# Patient Record
Sex: Male | Born: 1990 | Race: Black or African American | Hispanic: No | Marital: Single | Smoking: Former smoker
Health system: Northeastern US, Community
[De-identification: ages and names within clinical notes are randomized; demographics above are authoritative.]

## PROBLEM LIST (undated history)

## (undated) DIAGNOSIS — I10 Essential (primary) hypertension: Secondary | ICD-10-CM

## (undated) HISTORY — PX: TESTICLE SURGERY: SHX794

---
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
## 2012-09-14 ENCOUNTER — Emergency Department (HOSPITAL_COMMUNITY): Payer: No Typology Code available for payment source

## 2012-09-14 ENCOUNTER — Encounter (HOSPITAL_COMMUNITY): Payer: Self-pay | Admitting: *Deleted

## 2012-09-14 ENCOUNTER — Emergency Department (HOSPITAL_COMMUNITY)
Admission: EM | Admit: 2012-09-14 | Discharge: 2012-09-14 | Disposition: A | Discharge status: Home / Self Care | Payer: No Typology Code available for payment source | Attending: Emergency Medicine | Admitting: Emergency Medicine

## 2012-09-14 DIAGNOSIS — M545 Low back pain: Secondary | ICD-10-CM

## 2012-09-14 DIAGNOSIS — Y929 Unspecified place or not applicable: Secondary | ICD-10-CM | POA: Insufficient documentation

## 2012-09-14 DIAGNOSIS — S0993XA Unspecified injury of face, initial encounter: Secondary | ICD-10-CM | POA: Insufficient documentation

## 2012-09-14 DIAGNOSIS — M542 Cervicalgia: Secondary | ICD-10-CM

## 2012-09-14 DIAGNOSIS — S199XXA Unspecified injury of neck, initial encounter: Secondary | ICD-10-CM | POA: Insufficient documentation

## 2012-09-14 DIAGNOSIS — IMO0002 Reserved for concepts with insufficient information to code with codable children: Secondary | ICD-10-CM | POA: Insufficient documentation

## 2012-09-14 DIAGNOSIS — Y9389 Activity, other specified: Secondary | ICD-10-CM | POA: Insufficient documentation

## 2012-09-14 DIAGNOSIS — F17200 Nicotine dependence, unspecified, uncomplicated: Secondary | ICD-10-CM | POA: Insufficient documentation

## 2012-09-14 DIAGNOSIS — S0990XA Unspecified injury of head, initial encounter: Secondary | ICD-10-CM | POA: Insufficient documentation

## 2012-09-14 MED ORDER — CYCLOBENZAPRINE HCL 10 MG PO TABS: 10.0000 mg | ORAL_TABLET | Freq: Two times a day (BID) | ORAL | Status: DC | PRN

## 2012-09-14 MED ORDER — IBUPROFEN 800 MG PO TABS
800.0000 mg | ORAL_TABLET | Freq: Once | ORAL | Status: AC
  Administered 2012-09-14: 800 mg via ORAL
  Filled 2012-09-14: qty 1
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
## 2012-09-15 NOTE — ED Provider Notes (Signed)
Medical screening examination/treatment/procedure(s) were performed by non-physician practitioner and as supervising physician I was immediately available for consultation/collaboration.  Jerrika Ledlow, MD 09/15/12 1933 

## 2013-01-15 ENCOUNTER — Encounter (HOSPITAL_COMMUNITY): Payer: Self-pay | Admitting: *Deleted

## 2013-01-15 ENCOUNTER — Emergency Department (HOSPITAL_COMMUNITY)
Admission: EM | Admit: 2013-01-15 | Discharge: 2013-01-15 | Disposition: A | Discharge status: Home / Self Care | Payer: No Typology Code available for payment source | Attending: Emergency Medicine | Admitting: Emergency Medicine

## 2013-01-15 DIAGNOSIS — W010XXA Fall on same level from slipping, tripping and stumbling without subsequent striking against object, initial encounter: Secondary | ICD-10-CM | POA: Insufficient documentation

## 2013-01-15 DIAGNOSIS — Y9289 Other specified places as the place of occurrence of the external cause: Secondary | ICD-10-CM | POA: Insufficient documentation

## 2013-01-15 DIAGNOSIS — IMO0002 Reserved for concepts with insufficient information to code with codable children: Secondary | ICD-10-CM | POA: Insufficient documentation

## 2013-01-15 DIAGNOSIS — S0993XA Unspecified injury of face, initial encounter: Secondary | ICD-10-CM | POA: Insufficient documentation

## 2013-01-15 DIAGNOSIS — F17200 Nicotine dependence, unspecified, uncomplicated: Secondary | ICD-10-CM | POA: Insufficient documentation

## 2013-01-15 DIAGNOSIS — M62838 Other muscle spasm: Secondary | ICD-10-CM

## 2013-01-15 DIAGNOSIS — M549 Dorsalgia, unspecified: Secondary | ICD-10-CM

## 2013-01-15 DIAGNOSIS — Y93E9 Activity, other interior property and clothing maintenance: Secondary | ICD-10-CM | POA: Insufficient documentation

## 2013-01-15 MED ORDER — METHOCARBAMOL 750 MG PO TABS: 750.0000 mg | ORAL_TABLET | Freq: Four times a day (QID) | ORAL | Status: DC | PRN

## 2013-01-15 NOTE — ED Notes (Signed)
PT is here with back pain that radiates to neck and hurts with bending and reports neck cramps.  No incontinence
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
## 2013-01-16 NOTE — ED Provider Notes (Signed)
History/physical exam/procedure(s) were performed by non-physician practitioner and as supervising physician I was immediately available for consultation/collaboration. I have reviewed all notes and am in agreement with care and plan.   Nautica Hotz S Kvon Mcilhenny, MD 01/16/13 1203 

## 2013-11-03 ENCOUNTER — Emergency Department (HOSPITAL_COMMUNITY)
Admission: EM | Admit: 2013-11-03 | Discharge: 2013-11-03 | Disposition: A | Discharge status: Home / Self Care | Payer: No Typology Code available for payment source | Attending: Emergency Medicine | Admitting: Emergency Medicine

## 2013-11-03 ENCOUNTER — Other Ambulatory Visit: Payer: Self-pay

## 2013-11-03 ENCOUNTER — Encounter (HOSPITAL_COMMUNITY): Payer: Self-pay | Admitting: Emergency Medicine

## 2013-11-03 DIAGNOSIS — Y92009 Unspecified place in unspecified non-institutional (private) residence as the place of occurrence of the external cause: Secondary | ICD-10-CM | POA: Insufficient documentation

## 2013-11-03 DIAGNOSIS — W1809XA Striking against other object with subsequent fall, initial encounter: Secondary | ICD-10-CM | POA: Insufficient documentation

## 2013-11-03 DIAGNOSIS — Z87898 Personal history of other specified conditions: Secondary | ICD-10-CM

## 2013-11-03 DIAGNOSIS — R55 Syncope and collapse: Secondary | ICD-10-CM | POA: Insufficient documentation

## 2013-11-03 DIAGNOSIS — F17200 Nicotine dependence, unspecified, uncomplicated: Secondary | ICD-10-CM | POA: Insufficient documentation

## 2013-11-03 DIAGNOSIS — Y9389 Activity, other specified: Secondary | ICD-10-CM | POA: Insufficient documentation

## 2013-11-03 DIAGNOSIS — I498 Other specified cardiac arrhythmias: Secondary | ICD-10-CM | POA: Insufficient documentation

## 2013-11-03 DIAGNOSIS — Z043 Encounter for examination and observation following other accident: Secondary | ICD-10-CM | POA: Insufficient documentation

## 2013-11-03 DIAGNOSIS — Z79899 Other long term (current) drug therapy: Secondary | ICD-10-CM | POA: Insufficient documentation

## 2013-11-03 HISTORY — DX: Essential (primary) hypertension: I10

## 2013-11-03 LAB — BASIC METABOLIC PANEL
BUN: 11 mg/dL (ref 6–23)
CO2: 26 meq/L (ref 19–32)
Calcium: 8.7 mg/dL (ref 8.4–10.5)
Chloride: 104 mEq/L (ref 96–112)
Creatinine, Ser: 0.97 mg/dL (ref 0.50–1.35)
GFR calc Af Amer: >90 mL/min (ref >90)
GLUCOSE: 100 mg/dL — AB (ref 70–99)
POTASSIUM: 4 meq/L (ref 3.7–5.3)
Sodium: 141 mEq/L (ref 137–147)

## 2013-11-03 LAB — CBC
HEMATOCRIT: 47.1 % (ref 39.0–52.0)
HEMOGLOBIN: 15.9 g/dL (ref 13.0–17.0)
MCH: 32 pg (ref 26.0–34.0)
MCHC: 33.8 g/dL (ref 30.0–36.0)
MCV: 94.8 fL (ref 78.0–100.0)
Platelets: 213 10*3/uL (ref 150–400)
RBC: 4.97 MIL/uL (ref 4.22–5.81)
RDW: 13 % (ref 11.5–15.5)
WBC: 5.8 10*3/uL (ref 4.0–10.5)

## 2013-11-03 NOTE — ED Notes (Signed)
Pt states hes been having off and on dizzy spells for a few months. Pt states he's passed out twice this year. Pt states he passed out a week ago. Denies symptoms at this time.

## 2013-11-03 NOTE — Discharge Instructions (Signed)
Syncope Syncope is a fainting spell. This means the person loses consciousness and drops to the ground. The person is generally unconscious for less than 5 minutes. The person may have some muscle twitches for up to 15 seconds before waking up and returning to normal. Syncope occurs more often in elderly people, but it can happen to anyone. While most causes of syncope are not dangerous, syncope can be a sign of a serious medical problem. It is important to seek medical care.  CAUSES  Syncope is caused by a sudden decrease in blood flow to the brain. The specific cause is often not determined. Factors that can trigger syncope include:  Taking medicines that lower blood pressure.  Sudden changes in posture, such as standing up suddenly.  Taking more medicine than prescribed.  Standing in one place for too long.  Seizure disorders.  Dehydration and excessive exposure to heat.  Low blood sugar (hypoglycemia).  Straining to have a bowel movement.  Heart disease, irregular heartbeat, or other circulatory problems.  Fear, emotional distress, seeing blood, or severe pain. SYMPTOMS  Right before fainting, you may:  Feel dizzy or lightheaded.  Feel nauseous.  See all white or all black in your field of vision.  Have cold, clammy skin. DIAGNOSIS  Your caregiver will ask about your symptoms, perform a physical exam, and perform electrocardiography (ECG) to record the electrical activity of your heart. Your caregiver may also perform other heart or blood tests to determine the cause of your syncope. TREATMENT  In most cases, no treatment is needed. Depending on the cause of your syncope, your caregiver may recommend changing or stopping some of your medicines. HOME CARE INSTRUCTIONS  Have someone stay with you until you feel stable.  Do not drive, operate machinery, or play sports until your caregiver says it is okay.  Keep all follow-up appointments as directed by your  caregiver.  Lie down right away if you start feeling like you might faint. Breathe deeply and steadily. Wait until all the symptoms have passed.  Drink enough fluids to keep your urine clear or pale yellow.  If you are taking blood pressure or heart medicine, get up slowly, taking several minutes to sit and then stand. This can reduce dizziness. SEEK IMMEDIATE MEDICAL CARE IF:   You have a severe headache.  You have unusual pain in the chest, abdomen, or back.  You are bleeding from the mouth or rectum, or you have black or tarry stool.  You have an irregular or very fast heartbeat.  You have pain with breathing.  You have repeated fainting or seizure-like jerking during an episode.  You faint when sitting or lying down.  You have confusion.  You have difficulty walking.  You have severe weakness.  You have vision problems. If you fainted, call your local emergency services (911 in U.S.). Do not drive yourself to the hospital.  MAKE SURE YOU:  Understand these instructions.  Will watch your condition.  Will get help right away if you are not doing well or get worse. Document Released: 05/11/2005 Document Revised: 11/10/2011 Document Reviewed: 07/10/2011 [facility] Patient Information 2014 [place].   Emergency Department Resource Guide 1) Find a Doctor and Pay Out of Pocket Although you won't have to find out who is covered by your insurance plan, it is a good idea to ask around and get recommendations. You will then need to call the office and see if the doctor you have chosen will accept you as a  new patient and what types of options they offer for patients who are self-pay. Some doctors offer discounts or will set up payment plans for their patients who do not have insurance, but you will need to ask so you aren't surprised when you get to your appointment.  2) Contact Your Local Health Department Not all health departments have doctors that can see patients for  sick visits, but many do, so it is worth a call to see if yours does. If you don't know where your local health department is, you can check in your phone book. The CDC also has a tool to help you locate your state's health department, and many state websites also have listings of all of their local health departments.  3) Find a [facility] If your illness is not likely to be very severe or complicated, you may want to try a walk in clinic. These are popping up all over the country in pharmacies, drugstores, and shopping centers. They're usually staffed by nurse practitioners or physician assistants that have been trained to treat common illnesses and complaints. They're usually fairly quick and inexpensive. However, if you have serious medical issues or chronic medical problems, these are probably not your best option.  No Primary Care Doctor: - Call Health Connect at  [phone] - they can help you locate a primary care doctor that  accepts your insurance, provides certain services, etc. - Physician Referral Service- [phone]  Chronic Pain Problems: Organization         Address  Phone   Notes  [facility]  [phone] Patients need to be referred by their primary care doctor.   Medication Assistance: Organization         Address  Phone   Notes  [facility] Medication [facility] [address] [place], [place] [address] [phone] --Must be a resident of [facility] -- Must have NO insurance coverage whatsoever (no Medicaid/ Medicare, etc.) -- The pt. MUST have a primary care doctor that directs their care regularly and follows them in the community   [facility]  [phone]   [employer]  [phone]    Agencies that provide inexpensive medical care: Organization         Address  Phone   Notes  [place]  [phone]   [facility]    [phone]   [facility]  [place], [place] [address] [phone]   [place] [address], [place] [phone]   Planned Parenthood    [phone]   [facility]    [phone]   [address], [place] Phone:  [phone], Fax:  [phone] Hours of Operation:  9 am - 6 pm, M-F.  Also accepts Medicaid/Medicare and self-pay.  [facility] for [place], [address], [place] Phone: [phone], Fax: [phone]. Hours of Operation:  8:30 am - 5:30 pm, M-F.  Also accepts Medicaid and self-pay.  [facility] [place] [address] [place] Phone: [phone]   [place], [place], [place] [phone], Ext. [phone] Mondays & Thursdays: 7-9 AM.  First 15 patients are seen on a first come, first serve basis.    [place] Providers:  Organization         Address  Phone   Notes  [facility] [address], [place] [phone] Also accepts self-pay patients.  [facility] [address], [place]  [phone]   [place], [address], [place] [phone]   [facility] [address] [place] [phone]   Lucianne Lei [address], [place]   [phone] Only accepts [place] Access [place] patients after they have their name applied to their card.   Self-Pay (no insurance) in [facility]:  Organization         Address  Phone   Notes  Sickle Cell Patients, [facility] [place] [phone]   [facility] [place] [phone]   [facility] [place], [place], [place] [phone]   [facility]/Dr. Osei-Bonsu  [address], [place] or [address], [place] [phone] Phone number for both [place] and [place] locations is the same.  [facility] [address], [place] [phone]   [facility] [address], [place] or [address] [phone]   [facility] [address] [place] [phone], phone; [phone], fax Sees patients 1st and 3rd Saturday of every month.  Must not qualify for public or private insurance (i.e. Medicaid, Medicare, [place] Health Choice, Veterans' Benefits)  Household income should be no more than 200% of the poverty level The clinic cannot treat you if you are pregnant or think you are pregnant  Sexually transmitted diseases are not treated at the clinic.    Dental Care: Organization         Address  Phone  Notes  [facility] [place] [phone] Accepts children up to age 31 who are enrolled in [place] or [place]; pregnant women with a Medicaid card; and children who have applied for Medicaid or [place] Health Choice, but were declined, whose parents can pay a reduced fee at time of service.  [facility] Department of [facility]  [address], [place] [phone] Accepts children up to age 66 who are enrolled in [place] or [place]; pregnant women with a Medicaid card; and children who have applied for Medicaid or [place] Health Choice, but were declined, whose parents can pay a reduced fee at time of service.  [place] Adult [facility] Access PROGRAM  [place] [phone] Patients are seen by appointment only. Walk-ins are not accepted. [place] will see patients 31 years of age and older. Monday - Tuesday (8am-5pm) Most Wednesdays (8:30-5pm) $30 per visit, cash only  [facility] Adult Dental Access PROGRAM  [address], [facility] [phone] Patients are seen by appointment only. Walk-ins are not accepted. [place] will see patients 34 years of age and older. One Wednesday Evening (Monthly: Volunteer Based).  $30 per visit, cash only  [place]  [phone] for adults; Children under age 2, call Graduate Pediatric Dentistry at [phone]. Children aged 25-14, please call [phone] to request a pediatric application.  Dental services are provided in all areas of dental care including fillings, crowns  and bridges, complete and partial dentures, implants, gum treatment, root canals, and extractions. Preventive care is also provided. Treatment is provided to both adults and children. Patients are selected via a lottery and there is often a waiting list.   [facility] [address], [place]  [phone] [email or web address]   [facility] [address], [place], [place] [phone], Ext. [phone] Second and Fourth Thursday of each month, opens at 6:30 AM; Clinic ends at 9 AM.  Patients are seen on a first-come first-served basis, and a limited number are seen during each clinic.   [facility]  [address], [place], [place] [phone]   Eligibility Requirements You must have lived in [place], [place], or [place] counties for at least the last three months.   You cannot be eligible for state or federal sponsored [place] Cityhealthcare insurance, including CIG[employer]Veterans Administration, [place]icaid, or [employer]Medicare.   You generally cannot be eligible for healthcare insurance through your employer.    How to apply: Eligibility screenings are held every Tuesday and Wednesday afternoon from 1:00 pm until 4:00 pm. You do not need an appointment for the interview!  [facility] [address], [place], [place] [phone]   [facility]  [phone]   [facility]  [phone]    [facility]  [phone]    Behavioral Health Resources in the Community: Intensive Outpatient Programs Organization         Address  Phone  Notes  [facility] [address], [place], [place] [phone]   [facility] [address], [place], [place] [phone]   ADS: [facility] Drug S[facility] [address], [place], [place]  [phone]   [facility] [address],  [place], [place] [phone] or [phone]   Substance Abuse Resources Organization         Address  Phone  Notes  [facility] Drug Services  [phone]   [facility]  [phone]   [facility]  [phone]   [facility]  [phone]   Residential & Outpatient Substance Abuse Program  [phone]   Psychological Services Organization         Address  Phone  Notes  [facility]  [phone]- [phone]   [facility]  [phone]- [phone]   [facility] [address] [facility] [address], [place] [phone] or [phone]    Mobile Crisis Teams Organization         Address  Phone  Notes  [facility], Mobile Crisis Care Unit  [phone]   [facility]  [address] [place], [place] [phone]   Doristine LocksSharon DeEsch [address] [place] [phone]    Self-Help/Support Groups Organization         Address  Phone             Notes  Mental Health Assoc. of [place] - variety of support groups  [phone] Call for more information  Narcotics Anonymous (NA), [facility] [address], [place]  2 meetings at this location   Statisticianesidential Treatment Programs Organization         Address  Phone  Notes  [facility] [address],    [place]  [phone]   [facility]  [address], [place] [phone], [place], [place] [phone]   [facility] [address], [place]  [phone] Admissions: 8am-3pm M-F  Incentives Substance [place] [address],    [place], [place] [phone]   [facility] [address] [place], [place], [place]   [facility] [address]  [place], [place]   [facility] - Intensive Outpatient [address], [place], [place]   [facility] ([place] [address]  [place], [place] [phone] or [phone]   [facility] (RTS) [address] [place], [place] Accepts Medicaid  [facility] [address],  [place] [phone] Substance Abuse/Addiction Treatment   [facility] Organization         Address  Phone  Notes  [facility]  [phone]   Domenic Schwab, PhD [address] [place], [place]   [phone] or [phone]   [place], [place] [phone]   [facility] [address], [place], [place] [phone] Insurance/Medicaid/sponsorship through [facility] and Families [address]                                    [place], [place] [phone] [place] [address], [place] [phone]    Dr. Adele Schilder  [phone]   [facility] Dept. 1) [address], [place] 2) [place] 3)  [place] [address], [place] [phone]  [phone]   [place] [phone] or [phone] (After Hours)

## 2013-11-03 NOTE — ED Notes (Signed)
The pts bp has been running high for awhile but he has never had it checked by a regular doctor to place him on meds.  4-5 days ago the pt reported that he fainted so he decided to come in for a check.  He is having a migraine headache now

## 2013-11-03 NOTE — ED Provider Notes (Signed)
CSN: [id number]     Arrival date & time 11/03/13  1817 History   First MD Initiated Contact with Patient 11/03/13 2152     Chief Complaint  Patient presents with  . Hypertension     (Consider location/radiation/quality/duration/timing/severity/associated sxs/prior Treatment) HPI  23 year old male presents ER for evaluation of recurrent syncope. Patient states since last year he has had several syncopal episodes.  He reports an episode happens when he stands up quickly, and sometimes happen for no reason. Last week he was at home, and when you get up from his chair he felt lightheadedness and dizziness and subsequently syncopized, fell forward hitting his forehead against the kitchen counter. He reports having 3 more episodes of syncope on that same date. He reports walk however they've recommend for him to be evaluated for his syncope. Throughout the week more family members urging him to go get checked out tonight he decided to come to the ER for further evaluation. At this time patient has no specific complaint. He reports that he has a family history of high blood pressure and he was told that he has high blood pressure however currently not taking any medication for it. He admits to using marijuana on occasion but none correlating with the syncope. Denies any other street drug use. Patient is a smoker. Denies history of alcohol abuse. Denies any prior history of exertional syncope. Denies heart palpitation. Does not think that he is dehydrated. Patient has no other complaints.  Past Medical History  Diagnosis Date  . Hypertension    History reviewed. No pertinent past surgical history. No family history on file. History  Substance Use Topics  . Smoking status: Current Every Day Smoker -- 1.00 packs/day    Types: Cigarettes  . Smokeless tobacco: Not on file  . Alcohol Use: Yes     Comment: occa    Review of Systems  All other systems reviewed and are negative.     Allergies   Shellfish allergy  Home Medications   Prior to Admission medications   Medication Sig Start Date End Date Taking? Authorizing Provider  cetirizine (ZYRTEC) 10 MG tablet Take 10 mg by mouth daily.   Yes Historical Provider, MD   BP 126/90  Pulse 76  Temp(Src) 98.6 F (37 C) (Oral)  Resp 16  Ht 5\' 8"  (1.727 m)  Wt 160 lb (72.576 kg)  BMI 24.33 kg/m2  SpO2 100% Physical Exam  Nursing note and vitals reviewed. Constitutional: He is oriented to person, place, and time. He appears well-developed and well-nourished. No distress.  Awake, alert, nontoxic appearance  HENT:  Head: Atraumatic.  Eyes: Conjunctivae are normal. Pupils are equal, round, and reactive to light. Right eye exhibits no discharge. Left eye exhibits no discharge.  Neck: Normal range of motion. Neck supple.  Cardiovascular: Regular rhythm, S1 normal and S2 normal.  Bradycardia present.   Pulmonary/Chest: Effort normal. No respiratory distress. He exhibits no tenderness.  Abdominal: Soft. There is no tenderness. There is no rebound.  Musculoskeletal: He exhibits no tenderness.  ROM appears intact, no obvious focal weakness  Neurological: He is alert and oriented to person, place, and time.  Skin: Skin is warm and dry. No rash noted.  Psychiatric: He has a normal mood and affect.    ED Course  Procedures (including critical care time)  10:21 PM Patient report recurrent syncopal episode for the past year with no prior workup. EKG did demonstrate evidence of sinus bradycardia but otherwise his labs are reassuring and  normal electrolytes.  Pt appears to be in shape, sinus brady may be his baseline.  However, i will check orthostatic vital sign.  If normal, pt will receive resources to find a PCP and i will give referral to cardiology for further care.  Smoking cessation discussed.  Pt currently has normal BP, no evidence of HTN on today's exam.    10:59 PM Pt will need to f/u for further management of his condition.   Normal orthostatic vital sign.  Pt request doctor's note to return to work.  Note given.  Care discussed with Dr. Gwendolyn GrantWalden.  Labs Review Labs Reviewed  BASIC METABOLIC PANEL - Abnormal; Notable for the following:    Glucose, Bld 100 (*)    All other components within normal limits  CBC    Imaging Review No results found.   EKG Interpretation None      Date: 11/03/2013  Rate: 51  Rhythm: sinus bradycardia  QRS Axis: normal  Intervals: normal  ST/T Wave abnormalities: normal  Conduction Disutrbances:none  Narrative Interpretation:   Old EKG Reviewed: none available    MDM   Final diagnoses:  H/O syncope    BP 114/77  Pulse 48  Temp(Src) 98.6 F (37 C) (Oral)  Resp 15  Ht 5\' 8"  (1.727 m)  Wt 160 lb (72.576 kg)  BMI 24.33 kg/m2  SpO2 100%     Fayrene HelperBowie Avin Gibbons, PA-C 11/03/13 2306

## 2013-11-04 NOTE — ED Provider Notes (Signed)
Medical screening examination/treatment/procedure(s) were performed by non-physician practitioner and as supervising physician I was immediately available for consultation/collaboration.   EKG Interpretation None         William Louden House[place], MD 11/04/13 0025 
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
## 2019-02-22 ENCOUNTER — Telehealth (HOSPITAL_BASED_OUTPATIENT_CLINIC_OR_DEPARTMENT_OTHER): Payer: Self-pay | Admitting: Family Medicine

## 2019-02-22 DIAGNOSIS — Z20828 Contact with and (suspected) exposure to other viral communicable diseases: Secondary | ICD-10-CM

## 2019-02-22 DIAGNOSIS — Z20822 Contact with and (suspected) exposure to covid-19: Secondary | ICD-10-CM

## 2019-02-22 NOTE — Progress Notes (Signed)
COVID-19     Review of Systems  Physical Exam

## 2019-03-01 ENCOUNTER — Ambulatory Visit
Admission: RE | Admit: 2019-03-01 | Discharge: 2019-03-01 | Disposition: A | Discharge status: Home / Self Care | Payer: BC Managed Care – PPO | Attending: Family Medicine | Admitting: Family Medicine

## 2019-03-06 LAB — COVID-19 CARE EVERYWHERE: COVID-19 CARE EVERYWHERE: NOT DETECTED — NL

## 2019-03-23 ENCOUNTER — Other Ambulatory Visit: Payer: Self-pay

## 2019-03-31 ENCOUNTER — Other Ambulatory Visit: Payer: Self-pay

## 2019-03-31 ENCOUNTER — Ambulatory Visit (HOSPITAL_BASED_OUTPATIENT_CLINIC_OR_DEPARTMENT_OTHER): Payer: BC Managed Care – PPO | Admitting: Family Medicine

## 2019-05-03 LAB — COVID-19 CARE EVERYWHERE: COVID-19 CARE EVERYWHERE: NOT DETECTED — NL

## 2019-05-10 ENCOUNTER — Ambulatory Visit (HOSPITAL_BASED_OUTPATIENT_CLINIC_OR_DEPARTMENT_OTHER): Payer: BC Managed Care – PPO | Admitting: Family Medicine

## 2019-05-11 ENCOUNTER — Other Ambulatory Visit: Payer: Self-pay

## 2019-05-11 ENCOUNTER — Ambulatory Visit (HOSPITAL_BASED_OUTPATIENT_CLINIC_OR_DEPARTMENT_OTHER): Payer: Self-pay | Admitting: Family Medicine

## 2019-05-28 ENCOUNTER — Other Ambulatory Visit: Payer: Self-pay

## 2019-05-29 ENCOUNTER — Encounter (HOSPITAL_BASED_OUTPATIENT_CLINIC_OR_DEPARTMENT_OTHER): Payer: Self-pay | Admitting: Family Medicine

## 2019-05-29 ENCOUNTER — Ambulatory Visit: Payer: BC Managed Care – PPO | Attending: Family Medicine | Admitting: Family Medicine

## 2019-05-29 DIAGNOSIS — M2141 Flat foot [pes planus] (acquired), right foot: Secondary | ICD-10-CM | POA: Insufficient documentation

## 2019-05-29 DIAGNOSIS — Z0000 Encounter for general adult medical examination without abnormal findings: Secondary | ICD-10-CM | POA: Insufficient documentation

## 2019-05-29 DIAGNOSIS — D229 Melanocytic nevi, unspecified: Secondary | ICD-10-CM | POA: Insufficient documentation

## 2019-05-29 DIAGNOSIS — M2142 Flat foot [pes planus] (acquired), left foot: Secondary | ICD-10-CM | POA: Insufficient documentation

## 2019-05-29 NOTE — Progress Notes (Signed)
CC: establish care     Luke Hammond is a 29 year old male presenting for to establish care    [facility]  Would like to be more active. Works out 3-5 times a week.  Yoga 5x a week  Tries to eat mostly vegetarian diet   Would like to get STD testing as it has been >6 months  Does report some isolation in working from home but manageable    #flat feet  -starting to affect hips, causing more pain  -would like to see a podiatrist    #atypical moles  -has a few atypical moles he would like to get looked at and possibly removed   -would like to see a dermatologist      There is no problem list on file for this patient.       Review of patient's family history indicates:  Problem: Diabetes      Relation: Father          Age of Onset: (Not Specified)  Problem: Thyroid      Relation: Father          Age of Onset: (Not Specified)       No current outpatient medications on file prior to visit.  No current facility-administered medications on file prior to visit.        Review of Patient's Allergies indicates:   Penicillins             Hives, Itching, Shortness of Breath    Comment:PCN Hives.--     Social History    Tobacco Use      Smoking status: Former Smoker        Types: Cigars    Alcohol use: Yes    Drug use: Yes      Types: Marijuana         Exam - vitals deferred in setting of COVID pandemic  Speaking in complete sentences, breathing comfortably       Assessment/Plan:  (Z00.00) Routine general medical examination at a health care facility  (primary encounter diagnosis)  Plan: HIV ANTIGEN ANTIBODY 5TH GEN, RPR, CHLAMYDIA GC        NAAT, LIPID PANEL, BASIC METABOLIC PANEL,         HEPATITIS C ANTIBODY    (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

## 2019-05-30 ENCOUNTER — Ambulatory Visit: Payer: BC Managed Care – PPO | Attending: Dermatology | Admitting: Dermatology

## 2019-05-30 ENCOUNTER — Encounter (HOSPITAL_BASED_OUTPATIENT_CLINIC_OR_DEPARTMENT_OTHER): Payer: Self-pay | Admitting: Dermatology

## 2019-05-30 ENCOUNTER — Other Ambulatory Visit: Payer: Self-pay

## 2019-05-30 DIAGNOSIS — D239 Other benign neoplasm of skin, unspecified: Secondary | ICD-10-CM | POA: Diagnosis not present

## 2019-05-30 DIAGNOSIS — D2271 Melanocytic nevi of right lower limb, including hip: Secondary | ICD-10-CM | POA: Diagnosis not present

## 2019-05-30 NOTE — Progress Notes (Signed)
Luke Hammond is a 29 year old male patientatypical moles, would like screening skin exam    HPI:   Luke Hammond is a very pleasant 29 year old male who presents for evaluation of a lesion of concern.  According to Randa Ngo lesion has been present for approximately 1 year on anterior thigh. Stable in size, color, and general appearance but initially became more raised when first developed and now stable. No bleeding. Irritating because it is raised. No bleeding. Also has a raised mole on R inner thigh also raised but Stable in size, color, and general appearance.  Asymptomatic, non-itchy, non-tender.    Also has a raised Wortmann bump on lower L leg x 5 years. Started as small bump but gradually over time. Now Stable in size, color, and general appearance. Does not recall any preceding trauma. Asymptomatic, non-itchy, non-tender.    No other dermatologic complaints or concerns today.    Pt reports a personal past dermatologic history of: none    Pt reports a familial past dermatologic history of: No personal/ family history of skin cancer.    Current use of sunscreen or sun protective clothing: increased sun exposure in the past, pcc use of sunscreen.        Soc hx: +marijuana. +etoh. accountant    Review of Systems - ,No recent fevers, chills, night sweats, weight loss, nausea, vomiting, diarrhea, headaches, cough, shortness of breath, arthralgias, myalgias      Past Medical History:  Past Medical History:  No date: NO SIGNIFICANT MEDICAL HISTORY    Current Medications:  No current outpatient medications on file.     No current facility-administered medications for this visit.        Allergies:  Review of Patient's Allergies indicates:   Penicillins             Hives, Itching, Shortness of Breath    Comment:PCN Hives.--    Hospital Problem List:  Active Problems:    * No active hospital problems. *      Vitals:  Extended Vitals not filed for this encounter.    Physical Exam  - L lower leg with round dome  shaped papule  - R inner thigh with Tukes dome shaped papule  - R anterior thigh with round but slightly irregular shaped papule      Assessment:  Very pleasant 29 year old male who presents for evaluation of a lesion of concern    1. Melanocytic nevi, R thigh x 2, benign appearing but R anterior thigh is irritated: All nevi benign appearing and similar to one another on exam. The importance of sun protection was discussed along with best methods (including clothing, sunscreens, avoidance of peak UV index hours). The alarming signs of pigmented (including ABCDEs) and non-pigmented (including nonhealing ulcers and scars without injury) skin lesions were reviewed. Self skin monitoring advised, and pt encouraged to report any suspicious lesions immediately.  -consider bx of irritated nevus on R anterior thigh    2. Dermatofibroma, L lower leg:  - Discussed dx. Reassured benign. ddx also include melanocytic nevus - benign appearing.      RTC 3 mos, sooner prn any changes, consider bx of irritated nevus on R anterior thigh.      Darnell Level, MD  05/30/2019

## 2019-05-30 NOTE — Telephone Encounter (Signed)
Photos for today's TV

## 2019-05-31 ENCOUNTER — Other Ambulatory Visit: Payer: Self-pay

## 2019-05-31 ENCOUNTER — Ambulatory Visit
Admission: RE | Admit: 2019-05-31 | Discharge: 2019-05-31 | Disposition: A | Discharge status: Home / Self Care | Payer: BC Managed Care – PPO | Attending: Family Medicine | Admitting: Family Medicine

## 2019-05-31 ENCOUNTER — Encounter (HOSPITAL_BASED_OUTPATIENT_CLINIC_OR_DEPARTMENT_OTHER): Payer: Self-pay | Admitting: Family Medicine

## 2019-05-31 DIAGNOSIS — Z0000 Encounter for general adult medical examination without abnormal findings: Secondary | ICD-10-CM | POA: Diagnosis not present

## 2019-05-31 LAB — BASIC METABOLIC PANEL
ANION GAP: 8 mmol/L (ref 5–15)
BUN (UREA NITROGEN): 6 mg/dL — ABNORMAL LOW (ref 7–18)
CALCIUM: 9.5 mg/dL (ref 8.5–10.1)
CARBON DIOXIDE: 29 mmol/L (ref 21–32)
CHLORIDE: 102 mmol/L (ref 98–107)
CREATININE: 1.2 mg/dL (ref 0.7–1.2)
ESTIMATED GLOMERULAR FILT RATE: >60 mL/min (ref >60)
Glucose Random: 83 mg/dL (ref 74–160)
POTASSIUM: 4.1 mmol/L (ref 3.5–5.1)
SODIUM: 139 mmol/L (ref 136–145)

## 2019-05-31 LAB — HIV ANTIGEN ANTIBODY 5TH GEN: HIVAGAB QUALITATIVE: NONREACTIVE

## 2019-05-31 LAB — LIPID PANEL
Cholesterol: 225 mg/dL (ref 0–239)
HIGH DENSITY LIPOPROTEIN: 108 mg/dL (ref 40–?)
LOW DENSITY LIPOPROTEIN DIRECT: 110 mg/dL (ref 0–189)
TRIGLYCERIDES: 54 mg/dL (ref 0–150)

## 2019-05-31 LAB — HEPATITIS C ANTIBODY: HEPATITIS C ANTIBODY: NONREACTIVE

## 2019-06-01 LAB — RPR: RPR QUAL: NONREACTIVE

## 2019-06-03 LAB — CHLAMYDIA GC NAAT
CHLAMYDIA TRACHOMATIS NAAT: NEGATIVE
NEISSERIA GONORRHOEAE NAAT: NEGATIVE

## 2019-06-06 ENCOUNTER — Encounter (HOSPITAL_BASED_OUTPATIENT_CLINIC_OR_DEPARTMENT_OTHER): Payer: Self-pay | Admitting: Podiatrist

## 2019-06-06 ENCOUNTER — Ambulatory Visit: Payer: BC Managed Care – PPO | Attending: Podiatrist | Admitting: Podiatrist

## 2019-06-06 ENCOUNTER — Other Ambulatory Visit: Payer: Self-pay

## 2019-06-06 DIAGNOSIS — Q666 Other congenital valgus deformities of feet: Secondary | ICD-10-CM | POA: Diagnosis not present

## 2019-06-06 DIAGNOSIS — M216X2 Other acquired deformities of left foot: Secondary | ICD-10-CM | POA: Diagnosis not present

## 2019-06-06 DIAGNOSIS — M216X1 Other acquired deformities of right foot: Secondary | ICD-10-CM | POA: Insufficient documentation

## 2019-06-06 DIAGNOSIS — M2142 Flat foot [pes planus] (acquired), left foot: Secondary | ICD-10-CM | POA: Insufficient documentation

## 2019-06-06 DIAGNOSIS — M2141 Flat foot [pes planus] (acquired), right foot: Secondary | ICD-10-CM | POA: Insufficient documentation

## 2019-06-06 HISTORY — DX: Flat foot (pes planus) (acquired), right foot: M21.41

## 2019-06-13 NOTE — Progress Notes (Signed)
Chief complaint: Bilateral flat feet    HPI: This 29 year old male presents with identified postural deficits to both feet consistent with pes planovalgus deformity.  He does not endorse any notable pain or dysfunction with the presentation.  He does remain quite active to include performance of yoga without challenge.  He does not suggest throughout his adolescence or early adult life having any notable secondary issues as a result of this foot presentation.  He presents today simply to have this evaluated for consideration of any preventative treatment.  He has attempted no prior formal podiatric care.    Review of Systems   HENT: Negative.    Cardiovascular: Negative.    Respiratory: Negative.    Endocrine: Negative.    Skin: Negative.    Musculoskeletal:        Per HPI   Gastrointestinal: Negative.    Neurological: Negative.      Past Medical History:  06/06/2019: Flat feet  No date: NO SIGNIFICANT MEDICAL HISTORY    Patient Active Problem List:     Flat feet    Review of Patient's Allergies indicates:   Penicillins             Hives, Itching, Shortness of Breath    Comment:PCN Hives.--    There are no discharge medications for this patient.     Past Surgical History:  No date: TESTICLE SURGERY      Comment:  as child     reports that he has quit smoking. His smoking use included cigars. He has never used smokeless tobacco. He reports current alcohol use. He reports current drug use. Drug: Marijuana.    family history includes Diabetes in his father; Thyroid in his father.    General exam: This is a 29 year old male who presents in no apparent distress.  Affect is good.  He is alert and oriented x3.    Lower extremity exam:    Vascular: Exhibits a warm and well-perfused foot bilateral.  Capillary filling time is immediate.  There is no pitting edema.  Distal pulses are palpable.  There are no prominent varicosities.  There is no calf tenderness.  Neurologic:Deep tendon reflexes patellar and ankle are equal and  symmetrical. Demonstrates intact cutaneous sensation with monofilament, tuning fork and light touch.There is absence of pathologic reflex at the ankle bilateral. There is no motor disturbance.  Orthopedic: Patient is found to be independently ambulatory in conventional footwear with no casually observed antalgic limp or altered gait.  There is no visible swelling or discoloration to either foot or ankle.  In a seated position with foot hanging patient does demonstrate limited instep vault.  He does exhibit restricted subtalar joint motion principally in E version.  Reduction to some level of neutral confirms forefoot to rear foot acquired supinatus.  With the foot held in neutral there is restriction of ankle joint dorsiflexion with both knee extension and flexion.  With weightbearing he does have a significant loss of the instep vault with notable calcaneal valgus.  Bilateral heel raise does suggest some inversion but not beyond neutral.  Hallux dorsiflexion does not reconstitute the arch.  He does not suggest any notable pain throughout the foot with evaluation.  Dermatological: Skin color and texture is within normal.  There are no open wounds or lesions.  There is no active bleeding or bruising.    Luke Hammond was seen today for flat feet.    Diagnoses and all orders for this visit:    Valgus deformity of  both feet    Acquired equinus deformity of both feet      I did have a discussion with the patient regarding his clinical presentation.  As he has been principally asymptomatic with no secondary dysfunction I am not recommending any aggressive intervention.  I do believe he would have absolute intolerance to any type of functional orthotic as this is a semirigid deformity.  He would be best served with some type of accommodative soft insole if required.  I did discuss appropriate stretching of the posterior muscle groups to the lower legs.  I explained that there is no literature or personal experience that would  suggest that he will have any notable problems with this down the road that require any prophylactic intervention at this time.  Clearly if he should develop secondary symptoms he may contact us immediately.  He endorsed understanding and was in agreement.    A majority of this note has been dictated with a voice recognition system. Occasional wrong-word or "sound-A-like" substitutions may have occurred due to the inherent limitations of voice recognition software. Read the chart carefully and recognize, using context, where substitutions have occurred. Please excuse any identified errors in spelling or syntax. Every effort has been made to appropriately edit and correct upon completion.

## 2019-06-30 ENCOUNTER — Ambulatory Visit: Payer: BC Managed Care – PPO | Attending: Family Medicine | Admitting: Family Medicine

## 2019-06-30 ENCOUNTER — Encounter (HOSPITAL_BASED_OUTPATIENT_CLINIC_OR_DEPARTMENT_OTHER): Payer: Self-pay | Admitting: Family Medicine

## 2019-06-30 ENCOUNTER — Other Ambulatory Visit: Payer: Self-pay

## 2019-06-30 DIAGNOSIS — H0011 Chalazion right upper eyelid: Secondary | ICD-10-CM | POA: Diagnosis not present

## 2019-06-30 NOTE — Progress Notes (Signed)
CC: eye complaint     Luke Hammond is a 29 year old male presenting for     #eye complaint   -x 3 weeks   -growing pea sized lump on right upper eyelid  -not painful, though slightly itchy  -not red/infected  -has not yet tried anything        Patient Active Problem List:     Flat feet       Review of patient's family history indicates:  Problem: Diabetes      Relation: Father          Age of Onset: (Not Specified)  Problem: Thyroid      Relation: Father          Age of Onset: (Not Specified)       No current outpatient medications on file prior to visit.  No current facility-administered medications on file prior to visit.        Review of Patient's Allergies indicates:   Penicillins             Hives, Itching, Shortness of Breath    Comment:PCN Hives.--     Social History    Tobacco Use      Smoking status: Former Smoker        Types: Cigars      Smokeless tobacco: Never Used    Alcohol use: Yes    Drug use: Yes      Types: Marijuana         Exam - vitals deferred in setting of COVID pandemic  Speaking in complete sentences, breathing comfortably  Pea sized lump on right upper eyelid        Assessment/Plan:  (H00.11) Chalazion of right upper eyelid  -recommend warm compresses several times a day   -reassured patient that it should start to improve, if not can get eye apt in place for next week or the following in [place] ( INT)

## 2019-07-14 ENCOUNTER — Encounter (HOSPITAL_BASED_OUTPATIENT_CLINIC_OR_DEPARTMENT_OTHER): Payer: Self-pay | Admitting: Ophthalmology

## 2019-07-14 ENCOUNTER — Ambulatory Visit: Payer: BC Managed Care – PPO | Attending: Ophthalmology | Admitting: Ophthalmology

## 2019-07-14 ENCOUNTER — Other Ambulatory Visit: Payer: Self-pay

## 2019-07-14 DIAGNOSIS — H0011 Chalazion right upper eyelid: Secondary | ICD-10-CM | POA: Diagnosis not present

## 2019-07-14 NOTE — Progress Notes (Signed)
New pt.   Has a bump inside RUL for 1 months.  Warm compress do not help.    Vision is good.    FH of Glaucoma (gr.mother).

## 2019-07-14 NOTE — Progress Notes (Signed)
for evaluation of a lump right upper lid  X 2 months    Impression,    1.Chalazion RUL  Plan-warm compresses 3-4 times per day    Options discussed-plan I and D of chalazion in the ambulatory procedure room.

## 2019-07-26 ENCOUNTER — Ambulatory Visit: Payer: BC Managed Care – PPO | Attending: Ophthalmology | Admitting: Ophthalmology

## 2019-07-26 ENCOUNTER — Other Ambulatory Visit: Payer: Self-pay

## 2019-07-26 DIAGNOSIS — H0011 Chalazion right upper eyelid: Secondary | ICD-10-CM | POA: Diagnosis present

## 2019-07-26 NOTE — Progress Notes (Signed)
for I and of chalazion RUL    The patient is having the following procedure today: I and D of chalazion   rul  The risks, benefits and alternatives of the procedure have been discussed with the ophthalmologist, and the patient has had his questions answered satisfactorily.    Universal Protocol for Invasive Procedures     Name of Procedure: I and D of Chalazion RUL     Pre- Procedure Verification Process:  A plan for performing the correct procedure, for the correct patient, at the correct site was verified. The patient was involved in the verification process. The Informed paper consent for Invasive Procedure form was signed by PATIENT and submitted into the patient's record. The proceduralist and other members of the clinical team reviewed relevant patient medical history, diagnostic test results, radiology test results and assembled all special equipment and supplies needed for this procedure. All elements of Pre- Procedure Verification Process were completed at 8:10 AM 07/26/19(date and time).      Marking the Procedure Site:   Procedure site was marked per [facility] policy. Using a surgical marker, procedure site was marked at 8:12 AM 07/26/19 (date and time)       Time-Out:   Time-Out was initiated by me. Participating team members: Advertising copywriter. All participating members of the team actively communicated and re-confirmed the following:   1) correct procedure, correct patient, correct side, correct site and correct position  2) completed informed consent  3) presence of all necessary equipment and supplies     Time-out was completed at 07/26/2019  8:12 AM           Participating Members of the TIME OUT: Rayven Hendrickson,S and Szwarc, D    Procedure:I and D of a Chalazion  Pre-pocedure Diagnosis: chalazion RUL  Post-procedure Diagnosis: same    Surgeon: Lonia Chimera,  Assistant: (nurse) Szwarc*  Type of Anesthesia:local    Findings: chalazion RUL with very thick drainage    Estimated blood Loss: insignificant  Specimens  Sent to Pathology: none  Complications: none  Other (e.g. implants): None  Drawing made:  How did patient tolerate? well  When will patient follow up prn    BP prior to procedure 131/85, pulse 60  Vital signs after the procedure: Blood pressure: 136/95 and Hear Rate 62      Nurses in the minor surgical suite at the [facility] are: Judy Pimple, RN and Sunnie Nielsen, RN

## 2019-07-26 NOTE — Addendum Note (Signed)
Addended by: Charise Carwin on: 07/26/2019 08:50 AM     Modules accepted: Orders, SmartSet

## 2019-07-26 NOTE — Patient Instructions (Signed)
Put a small amount of the antibiotic ointment in the right eye 2 times per day until tube finishes/ 4 days

## 2019-08-22 DIAGNOSIS — Z20828 Contact with and (suspected) exposure to other viral communicable diseases: Secondary | ICD-10-CM | POA: Diagnosis not present

## 2019-08-28 LAB — COVID-19 CARE EVERYWHERE
COVID-19 CARE EVERYWHERE PATIENT SYMPTOMATIC: UNDETERMINED
COVID-19 CARE EVERYWHERE: UNDETERMINED
COVID-19 CARE EVERYWHERE: UNDETERMINED
COVID-19 CARE EVERYWHERE: UNDETERMINED
COVID-19 CARE EVERYWHERE: UNDETERMINED
COVID-19 CARE EVERYWHERE: UNDETERMINED
SOURCE CARE EVERYWHERE: UNDETERMINED

## 2019-08-29 ENCOUNTER — Other Ambulatory Visit: Payer: Self-pay

## 2019-08-29 ENCOUNTER — Ambulatory Visit: Payer: BC Managed Care – PPO | Attending: Dermatology | Admitting: Dermatology

## 2019-08-29 DIAGNOSIS — D239 Other benign neoplasm of skin, unspecified: Secondary | ICD-10-CM | POA: Diagnosis not present

## 2019-08-29 NOTE — Progress Notes (Signed)
Luke Hammond is a very pleasant 29 year old male who presents to the dermatology clinic today for follow-up.    Last derm visit date: 05/30/19  Last derm visit diagnoses/concerns/ treatments:    1. Melanocytic nevi, R thigh x 2, benign appearing but R anterior thigh is irritated: All nevi benign appearing and similar to one another on exam. The importance of sun protection was discussed along with best methods (including clothing, sunscreens, avoidance of peak UV index hours). The alarming signs of pigmented (including ABCDEs) and non-pigmented (including nonhealing ulcers and scars without injury) skin lesions were reviewed. Self skin monitoring advised, and pt encouraged to report any suspicious lesions immediately.  -consider bx of irritated nevus on R anterior thigh    2. Dermatofibroma, L lower leg:  - Discussed dx. Reassured benign. ddx also include melanocytic nevus - benign appearing.      RTC 3 mos, sooner prn any changes, consider bx of irritated nevus on R anterior thigh.    Interim history:  Reports LOC from LV remain unchanged, occ irritated because they are raised. No bleeding or change in appearance or size.    Reports a new mole on L upper thigh since LV. Looks similar to other Strayer moles. Asymptomatic, non-itchy, non-tender. Stable in size, color, and general appearance. Does not recall any trauma or inflammation, bite or ingrown hairs preceding this lesion.    Rarely outdoors.     No lupus or autoimmune disease or risk factors for HIV.    Past Medical History:  06/06/2019: Flat feet  No date: NO SIGNIFICANT MEDICAL HISTORY    No current outpatient medications on file.    Review of Patient's Allergies indicates:   Penicillins             Hives, Itching, Shortness of Breath    Comment:PCN Hives.--    Physical Exam:  Gen: NAD, alert and oriented, pleasant  Focused exam of legs:  -  R inner thigh, L upper thigh, and L ankle with similar firm hyperpigmented indurated papules, +dimple sign (except for L  upper thigh, smallest). No pigment network    A/P: Very pleasant 29 year old male seen in dermatology clinic today for f/u evaluation of:    Dermatofibroma, R inner thigh, L upper thigh, and L ankle   - Discussed dx. Reassured benign. Discussed association with SLE and HIV if multiple lesions but no risk factors for these. HIV neg 05/31/19.  - handout provided    RTC prn.

## 2019-08-29 NOTE — Patient Instructions (Addendum)
Please perform regular self-skin exams, and notify your doctor if you notice lesions that are asymmetric, have irregular borders, color changes, growing in diameter, or are otherwise concerning to you. It is also recommended that you regularly wear SPF 30 or greater sunscreen and/or sun protective clothing when outdoors, wearing sunscreen has been shown to reduce the risk of skin cancers.    A - Asymmetry  B - Border  C - Color  D - Diameter  E - Evolution       ABCDEs of Melanoma   These basic guidelines are used by many dermatologists to help identify melanoma, the deadliest form of skin cancer.   Catching melanoma early could mean the difference between life and a life-threatening cancer. Knowing what to look for and performing regular self-skin exams may help you become more aware of unusual spots that should be brought to the attention of a dermatologist.   If you notice an unusual spot or a spot that has one or more of these characteristics, make an appointment with a dermatologist - preferably one who has experience with melanoma.   A - Asymmetrical Shape   Melanoma lesions are often irregular, or not symmetrical, in shape. Benign moles are usually symmetrical.    B - Border   Typically, non-cancerous moles have smooth, even borders. Melanoma lesions usually have irregular borders that are difficult to define.    C - Color   The presence of more than one color (blue, black, Coviello, tan, etc.) or the uneven distribution of color can sometimes be a warning sign of melanoma. Benign moles are usually a single shade of Hiraldo or tan.    D - Diameter   Melanoma lesions are often greater than 6 millimeters in diameter (about the size of a pencil eraser).    E E - Evolution (or Change)   The evolution of your mole(s) has become the most important factor to consider when it comes to diagnosing a melanoma. Knowing what is normal for YOU could save your life.        Sunscreens    Sun protection is an important part in  decreasing our risk of skin cancers, protecting against age spots, and minimizing wrinkles. We can actively participate in sun protection by avoiding excessive sun exposure and applying sunscreens.    We recommend daily use of sunscreens to face and tops of hands. With outdoor activities, sunscreens are recommended to exposed body areas. When applying sunscreen, be sure to use a full ounce (handful amount) to entire body 15-20 minutes prior to sun exposure. Reapply every 2 hours depending on your activity. Excessive sweating or activities which involve water require more frequent applications.    We recommend sunscreens which have both UVA and UVB protection (broad spectrum). Sun Protection factor (SPF) is a measurement of the UVB blockade. A minimum SPF of 30 is advised. When purchasing a sunscreen, look for a product which contains one of the following active ingredients: Avobenzone, Titanium Dioxide, or Zinc Oxide.    Newer products, such as Aveeno and Neutrogena, contain stabilizers which allow sunscreens to work longer on your skin. Mexoryl is another common stabilizer which is used in many cosmetic products on the market.    Suggested Sunscreens        *   Neutrogena Sunblock with Helioplex Stabilizer*        *   Aveeno Continuous Protection with Active Photobarrier Complex Stabilizer*        *     CeraVe Facial Sunscreen SPF 30*        *   Cetaphil Facial Sunscreen SPF 30*        *   Coppertone Shade Lotion*        *   Eucerin Everyday Protection SPF 30*        *   Vanicream Sunscreen+    ?    For those with sensitive skin, we recommend using a product with just a physical blocker such as titanium dioxide or zinc oxide. These can be found in Neutrogena Sensitive Skin Sunblock, Vanicream Sunscreen Sensitive Skin, Blue Lizard Sensitive Skin and EltaMD UV Physical Sunscreen.    * Available at [facility], [facility], [facility] and [facility]    + Available online or at specialty pharmacies

## 2019-09-01 ENCOUNTER — Other Ambulatory Visit: Payer: Self-pay

## 2019-09-01 ENCOUNTER — Ambulatory Visit: Payer: BC Managed Care – PPO | Attending: Ophthalmology | Admitting: Ophthalmology

## 2019-09-01 DIAGNOSIS — H0011 Chalazion right upper eyelid: Secondary | ICD-10-CM | POA: Insufficient documentation

## 2019-09-01 NOTE — Progress Notes (Signed)
For evaluation of bump on RUL after I and D of chalazion 07/26/19       Impression,    1.S/P I and D of chalazion upper lid OD    Procedure note indicate large, thick drainage at time of I and D    At this point he had a small bum at the site of previous I and D that may be residual inflammation.  There was no pyogenic granuloma with eversion of lid.    He was instructed to apply warm compresses 3-4 times per day and see what happens over the next month or so.    He will RTC if the Bump persists or worsens.

## 2019-09-01 NOTE — Progress Notes (Signed)
Pt here for fu still has chalazion RUL after  I and D 07/26/19.    Would like the rest of it drained.

## 2020-07-19 ENCOUNTER — Telehealth (HOSPITAL_BASED_OUTPATIENT_CLINIC_OR_DEPARTMENT_OTHER): Payer: Self-pay

## 2020-07-19 NOTE — Telephone Encounter (Signed)
Called patient and left a message stating Dr Santo Held, is no longer here and needs to select a pcp

## 2020-09-22 DEATH — deceased

## 2022-07-25 ENCOUNTER — Emergency Department (HOSPITAL_BASED_OUTPATIENT_CLINIC_OR_DEPARTMENT_OTHER): Payer: BC Managed Care – PPO

## 2022-07-25 ENCOUNTER — Other Ambulatory Visit: Payer: Self-pay

## 2022-07-25 ENCOUNTER — Emergency Department
Admission: RE | Admit: 2022-07-25 | Discharge: 2022-07-25 | Disposition: A | Discharge status: Home / Self Care | Payer: BC Managed Care – PPO | Attending: Emergency Medicine | Admitting: Emergency Medicine

## 2022-07-25 DIAGNOSIS — M79671 Pain in right foot: Secondary | ICD-10-CM | POA: Diagnosis present

## 2022-07-25 DIAGNOSIS — Y9289 Other specified places as the place of occurrence of the external cause: Secondary | ICD-10-CM | POA: Diagnosis not present

## 2022-07-25 DIAGNOSIS — M7989 Other specified soft tissue disorders: Secondary | ICD-10-CM | POA: Diagnosis not present

## 2022-07-25 DIAGNOSIS — Y9301 Activity, walking, marching and hiking: Secondary | ICD-10-CM | POA: Insufficient documentation

## 2022-07-25 DIAGNOSIS — X500XXA Overexertion from strenuous movement or load, initial encounter: Secondary | ICD-10-CM | POA: Insufficient documentation

## 2022-07-25 DIAGNOSIS — S92354A Nondisplaced fracture of fifth metatarsal bone, right foot, initial encounter for closed fracture: Secondary | ICD-10-CM | POA: Insufficient documentation

## 2022-07-25 DIAGNOSIS — W19XXXA Unspecified fall, initial encounter: Secondary | ICD-10-CM

## 2022-07-25 DIAGNOSIS — S92355A Nondisplaced fracture of fifth metatarsal bone, left foot, initial encounter for closed fracture: Secondary | ICD-10-CM

## 2022-07-25 NOTE — ED Provider Notes (Addendum)
Chief Concern:  Luke Hammond is a 32 year old man with no significant past medical history presenting to the ED following a right foot injury    History of Present Illness:  Luke Hammond was in his usual state of health before yesterday, when he was going home after playing basketball, accidentally stepped on a rock, and heard a pop. He went home after this, removed his shoes and per patients words, "he saw metatarsal pop out." He did not take anything for the pain. He was unable to walk/move/place pressure on his foot so he ubered to the emergency department this morning. Patient says the pain is mostly on dorsal foot, not at ankle. He has rolled his ankle before but never injured his foot in this way.     Past Medical History:  History of dermatofibromas     Past Surgical History:  Past Surgical History:  No date: TESTICLE SURGERY      Comment:  as child     Medications:  None    Allergies:  Penicillin     Social History:  Denies current alcohol, drug, tobacco use    Review of patient's family history indicates:  Problem: Diabetes      Relation: Father          Age of Onset: (Not Specified)  Problem: Thyroid      Relation: Father          Age of Onset: (Not Specified)  Problem: Glaucoma      Relation: Maternal Grandmother          Age of Onset: (Not Specified)    Labs: None to display    Imagining: Revealed non-displaced fracture at base of 5th metatarsal     Physical Exam:   07/25/22  0949   BP: 137/85   Pulse: 69   Resp: 16   Temp: 97.3 F   SpO2: 100%   Weight: 90.7 kg (200 lb)      General: Patient appears well and in no apparent distress  Cardiac: Normal S1/S2, no murmurs gallops or rubs  Pulmonary: Clear to auscultation bilaterally in anterior and posterior lung fields  Extremities: Sensitive to soft touch bilaterally. Tenderness to soft touch laterally on dorsal foot. Feet are warm, well perfused bilaterally. No edema at the ankles bilaterally however mild edema on right foot below the ankle. DP pulses 2+  bilaterally  *rest of exam deferred*     Assessment and Plan:  Luke Hammond is a 32 year old man with no significant past medical history presenting to the ED following a right foot injury, currently stable condition    Right foot and ankle X-Rays ordered, revealed non-displaced fracture at the base of the 5th metatarsal. Provided patient with post-op surgical boot, placed order for crutches, referred to podiatry for outpatient follow-up    Dispo: Home with outpatient follow-up with podiatry

## 2022-07-25 NOTE — ED Triage Note (Signed)
Pt to ED via wheelchair s/p rolling R ankle yesterday while running across the street, heard a "pop", now unable to bear weight on it.

## 2022-07-25 NOTE — Narrator Note (Signed)
Patient Disposition  Patient education for diagnosis, medications, activity, diet and follow-up.  Patient left ED 11:11 AM.  Patient rep received written instructions.    Interpreter to provide instructions: No    Patient belongings with patient: YES    Have all existing LDAs been addressed? No    Have all IV infusions been stopped? No    Destination: Discharged to home

## 2022-07-25 NOTE — Discharge Instructions (Signed)
You were seen in the ER with foot pain, and found to have a 5th metatarsal fracture.    You were given a postop shoe as well as crutches.  Please do not place any weight on your foot until you are seen by the foot doctors.    You may take Ibuprofen (also known as Advil, Motrin) 600 mg every 6 hours and/or Acetaminophen (also known as Tylenol) 500 to 1000 mg every 6 hours as needed for your pain.    Return to the emergency room with any new or concerning symptoms.

## 2022-07-29 ENCOUNTER — Ambulatory Visit: Payer: BC Managed Care – PPO | Attending: Podiatrist | Admitting: Podiatrist

## 2022-07-29 ENCOUNTER — Encounter (HOSPITAL_BASED_OUTPATIENT_CLINIC_OR_DEPARTMENT_OTHER): Payer: Self-pay | Admitting: Podiatrist

## 2022-07-29 ENCOUNTER — Other Ambulatory Visit: Payer: Self-pay

## 2022-07-29 DIAGNOSIS — S93401A Sprain of unspecified ligament of right ankle, initial encounter: Secondary | ICD-10-CM | POA: Diagnosis present

## 2022-07-29 DIAGNOSIS — S92351A Displaced fracture of fifth metatarsal bone, right foot, initial encounter for closed fracture: Secondary | ICD-10-CM | POA: Insufficient documentation

## 2022-07-29 NOTE — Progress Notes (Signed)
CC: Right foot and ankle pain    HPI  Luke Hammond is a 32 year old male who presents to clinic in follow-up from emergency department visit on 3/2.  He states that last Friday he was in the middle of an intense run when he stepped on a rock and inverted his ankle.  He states that he felt a "pop".  As this did feel different from a previous injury he did decide to be evaluated in the emergency department.  Radiographs of the right ankle and foot were obtained which did reveal a nondisplaced fracture of the base of the fifth metatarsal.  He was placed in a surgical shoe and provided with crutches.  He was also referred to our service for specialty care.  He has been unable to apply full weight to the right foot since the injury.  He has no other pedal complaints.    ROS: All systems were reviewed and are negative expect per HPI     PAST MEDICAL HISTORY  Denies    PAST SURGICAL HISTORY  Noncontributory     MEDICATIONS  None    ALLERGIES  Review of Patient's Allergies indicates:   Penicillins             Hives, Itching, Shortness of Breath    Comment:PCN Hives.--    SOCIAL HISTORY  Denies tobacco use.  Admits to occasional alcohol use and also smokes marijuana.    FAMILY HISTORY  Family history reviewed.  No pertinent family history.    OBJECTIVE:  General: AAOx3. NAD. Pleasant. Non toxic appearance.     Focused right lower extremity exam  Vascular: DP and PT pulses faintly palpable secondary to edema. Capillary fill time brisk to digits 1-5. Skin temperature and gradient is within normal without asymmetry between the extremities.  Neurologic: Gross sensation intact.  Dermatologic: No abrasions or lacerations noted.  No erythema noted.  There is mild ecchymosis noted to the lateral hindfoot.  Orthopedic: There is significant edema noted to the foot and ankle.  There is tenderness to palpation over the footprint of the ATFL as well as the base of the fifth metatarsal.  He is able to actively range the foot and ankle  although somewhat limited secondary to pain and edema.  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  No tenderness over the dorsal navicular.    RADIOLOGY:  3 nonweightbearing views of the right foot do reveal an incomplete fracture through the base of the fifth metatarsal at the metaphyseal diaphyseal junction that is nondisplaced.  There is what appears to be an avulsion type fracture of the dorsal navicular.    3 nonweightbearing views of the right ankle does not reveal any osseous abnormality.  There is increase in soft tissue density and volume along the lateral ankle and lateral hindfoot.    ASSESSMENT:  Right grade 2 ankle sprain  Right closed nondisplaced fifth metatarsal base fracture    PLAN:  Clinical and radiographic findings were discussed with the patient.  Radiographs that were obtained on 3/2 were personally reviewed.  These did reveal the fracture of the base of the fifth metatarsal.  This does appear to be in the metaphyseal diaphyseal junction however is nondisplaced.  We do believe that this can be treated nonoperatively.  There was an incidental finding of a fracture over the dorsal navicular however he has no tenderness over this area and may be related to an old injury.  He does also present with associated lateral ankle ligament injury.  We recommend immobilization for the treatment of both injuries in a CAM boot and he was dispensed this today.  He very may remove the boot to perform range of motion exercises of the ankle.  He will follow-up in 4 weeks for reevaluation or sooner if problems arise.      Tobey Grim PGY 3  Pg [phone]

## 2022-07-30 NOTE — Progress Notes (Signed)
I have discussed the patient's case with the resident and was personally seen.  I have reviewed the patient's medical history, the resident's findings on physical examination, and the patient's diagnosis and treatment plan. I agree with the documentation as written by the resident.     Luke Hammond, DPM, FACFAS

## 2022-08-26 ENCOUNTER — Other Ambulatory Visit: Payer: Self-pay

## 2022-08-26 ENCOUNTER — Ambulatory Visit
Admission: RE | Admit: 2022-08-26 | Discharge: 2022-08-26 | Disposition: A | Discharge status: Home / Self Care | Payer: BC Managed Care – PPO | Attending: Vascular & Interventional Radiology | Admitting: Vascular & Interventional Radiology

## 2022-08-26 ENCOUNTER — Encounter (HOSPITAL_BASED_OUTPATIENT_CLINIC_OR_DEPARTMENT_OTHER): Payer: Self-pay | Admitting: Podiatrist

## 2022-08-26 ENCOUNTER — Ambulatory Visit (HOSPITAL_BASED_OUTPATIENT_CLINIC_OR_DEPARTMENT_OTHER): Payer: BC Managed Care – PPO | Admitting: Podiatrist

## 2022-08-26 DIAGNOSIS — S92351D Displaced fracture of fifth metatarsal bone, right foot, subsequent encounter for fracture with routine healing: Secondary | ICD-10-CM | POA: Diagnosis present

## 2022-08-28 NOTE — Progress Notes (Signed)
Situation: Follow-up closed fracture fifth metatarsal base right    Background: This is a 32 year old male who did sustain a zone 1 fracture of the fifth metatarsal base right.  This has been treated with immobilization.  Overall he endorses doing well and there is some suggestion that he has increased his physical activity.  He is not endorsing any significant pain.    Review of Systems   Constitutional: Negative.   All other systems reviewed and are negative.    Assessment: This is a pleasant 32 year old male presents no apparent distress.  Affect is good.  He is alert and oriented x 3.  Habitus is within normal.    Patient does ambulate in walking boot right.  There is no notable swelling or discoloration of the right foot or ankle.  Neurovascular status is intact.  He does endorse some slight tenderness with palpation to the styloid process of the fifth metatarsal right.  There is no visible or palpable deformity.  He can actively range the foot at the ankle as well as flex and extend the digits.    Imaging: Updated x-rays of the right foot confirm persistence of visualization of fracture lines but without any further displacement.    Recommendation: I did have discussion with the patient regarding his clinical presentation.  I would encourage continued use of the walking boot for 2 more weeks and then gradual transition to a rigid sole athletic style shoe to tolerance.  I did discuss concerning findings of increased pain, swelling, deformity or dysfunction that would warrant communication.    Return to clinic 4 weeks    A majority of this note has been dictated with a voice recognition system. Occasional wrong-word or "sound-A-like" substitutions may have occurred due to the inherent limitations of voice recognition software. Read the chart carefully and recognize, using context, where substitutions have occurred. Please excuse any identified errors in spelling or syntax. Every effort has been made to  appropriately edit and correct upon completion.

## 2022-09-23 ENCOUNTER — Other Ambulatory Visit: Payer: Self-pay

## 2022-09-23 ENCOUNTER — Ambulatory Visit: Payer: BC Managed Care – PPO | Attending: Podiatrist | Admitting: Podiatrist

## 2022-09-23 DIAGNOSIS — S92351D Displaced fracture of fifth metatarsal bone, right foot, subsequent encounter for fracture with routine healing: Secondary | ICD-10-CM | POA: Insufficient documentation

## 2022-09-23 NOTE — Progress Notes (Signed)
Podiatry Clinic Progress Note    CC: Patient presents with:  Closed Fracture: Rt      Subjective: Luke Hammond is a 32 year old male who presents to clinic today for follow-up regarding a zone 1 fracture of the fifth metatarsal base right.  He has been treated with immobilization and has gradually transition to a athletic shoe without issue.  Overall, he reports that there is good improvement.  He does report some pain to the fracture site with increased activity as well as to the medial side of ankle right.  He reports that he has not had pain in the medial ankle prior to his injury.    ROS  Constitutional: Denies fever, chills, nausea, vomitting  Cardiovascular: Denies chest pain, shortness of breath  Pertinent positives are outlined in the HPI. ROS otherwise negative.    Lower Extremity Physical Exam  General:  AAOx3. NAD. Non toxic appearance.    Vascular: DP and PT pulses palpable. Capillary refill time immediate to all digits. Skin temperature and gradient is normal without asymmetry between the extremities.   Musculoskeletal: Ambulating nonantalgic with regular shoe gear.  Within normal limits for mass and strength for all muscle groups bilaterally.  There is mild pain on palpation to the fracture site at the fifth metatarsal base right.  There is no tenderness around the medial ankle.  Patient able to actively range the ankle without pain.  There is no noticeable edema to the fracture site.  He does present with flatfoot on resting exam bilaterally.  Dermatological: Skin is supple with normal color and texture. No open lesions noted.  Neurological: Intact epicritic sensation to bilateral feet. No appreciable gross motor disturbances.    Assessment  -Closed fracture of the base of the fifth metatarsal bone, right    Plan  -Patient was examined and all findings discussed.  -Overall, patient has been progressing well following his injury with return to rigid supportive shoe gear.  We did explain that given he  did not have pain to the right medial ankle prior to injury, this likely is a result of compensatory gait changes due to his injury and should resolve once he is fully healed.  At this time, we do not believe he requires orthotics.  He may continue to advance in activity levels.  We did encourage him to return for a reassessment if pain persists.    Carlyle Dolly, DPM, PGY-1  09/23/2022    A majority of this note has been dictated with a voice recognition system. Occasional wrong-word or "sound-A-like" substitutions may have occurred due to the inherent limitations of voice recognition software. Read the chart carefully and recognize, using context, where substitutions have occurred. Please excuse any identified errors in spelling or syntax. Every effort has been made to appropriately edit and correct upon completion.

## 2022-09-26 NOTE — Progress Notes (Signed)
I have discussed the patient's case with the resident and was personally seen.  I have reviewed the patient's medical history, the resident's findings on physical examination, and the patient's diagnosis and treatment plan. I agree with the documentation as written by the resident.     Momodou Consiglio H Mckayla Mulcahey, DPM, FACFAS

## 2022-12-30 ENCOUNTER — Ambulatory Visit: Payer: BC Managed Care – PPO | Attending: Family Medicine | Admitting: Family Medicine

## 2022-12-30 ENCOUNTER — Encounter (HOSPITAL_BASED_OUTPATIENT_CLINIC_OR_DEPARTMENT_OTHER): Payer: Self-pay | Admitting: Family Medicine

## 2022-12-30 ENCOUNTER — Other Ambulatory Visit: Payer: Self-pay

## 2022-12-30 VITALS — BP 111/72 | HR 72 | Temp 97.4°F | Ht 77.0 in | Wt 189.8 lb

## 2022-12-30 DIAGNOSIS — Z0000 Encounter for general adult medical examination without abnormal findings: Secondary | ICD-10-CM | POA: Insufficient documentation

## 2022-12-30 DIAGNOSIS — Z113 Encounter for screening for infections with a predominantly sexual mode of transmission: Secondary | ICD-10-CM | POA: Insufficient documentation

## 2022-12-30 DIAGNOSIS — Z789 Other specified health status: Secondary | ICD-10-CM | POA: Diagnosis present

## 2022-12-30 LAB — HEPATITIS B SURFACE AB QUANT: HEPATITIS B SURFACE AB QUANT: 70.7 m[IU]/mL (ref 0.0–8.4)

## 2022-12-30 LAB — HEMOGLOBIN A1C
ESTIMATED AVERAGE GLUCOSE: 120 mg/dL (ref 74–160)
HEMOGLOBIN A1C: 5.8 % — ABNORMAL HIGH (ref 4.0–5.6)

## 2022-12-30 LAB — HEPATITIS B PCR QUAL TO QUANT: HEPATITIS B SURFACE ANTIGEN: NONREACTIVE

## 2022-12-30 NOTE — Progress Notes (Signed)
Clinic Note:    SUBJECTIVE:  Luke Hammond is a 32 year old male with the following active problem list:    Patient Active Problem List:     Flat feet      CC:     Pt re-establishing care  Would like sti screen  Exercising, eating well  No smoking, minimal ETOH  Up to date on dental care    Past Medical History:  06/06/2019: Flat feet  No date: NO SIGNIFICANT MEDICAL HISTORY    Past Surgical History:  No date: TESTICLE SURGERY      Comment:  as child    No current outpatient medications on file prior to visit.  No current facility-administered medications on file prior to visit.      Review of Patient's Allergies indicates:   Penicillins             Hives, Itching, Shortness of Breath    Comment:PCN Hives.--    Review of patient's family history indicates:  Problem: Diabetes      Relation: Father          Age of Onset: (Not Specified)  Problem: Thyroid      Relation: Father          Age of Onset: (Not Specified)  Problem: Glaucoma      Relation: Maternal Grandmother          Age of Onset: (Not Specified)      Social History     Socioeconomic History    Marital status: Single     Spouse name: Not on file    Number of children: Not on file    Years of education: Not on file    Highest education level: Not on file   Occupational History    Not on file   Tobacco Use    Smoking status: Former     Types: Cigars     Passive exposure: Past    Smokeless tobacco: Never   Substance and Sexual Activity    Alcohol use: Yes    Drug use: Yes     Types: Marijuana    Sexual activity: Not Currently   Other Topics Concern    Not on file   Social History Narrative    05/29/2019    Living in [place] alone    Currently works for [employer]    Social Determinants of Health  Financial Resource Strain: Not on File (01/16/2018)      Received from [facility], [facility]          Financial Resource Strain: 0  Food Insecurity: Not on File (01/16/2018)      Received from [place], [place]      Food Insecurity          Food:  0  Transportation Needs: Not on File (01/16/2018)      Received from [facility], [facility]          Transportation: 0  Physical Activity: Not on File (01/16/2018)      Received from [facility], [place]      Physical Activity          Physical Activity: 0  Stress: Not on File (01/16/2018)      Received from [facility], [place]      Stress          Stress: 0  Social Connections: Not on File (01/16/2018)      [facility]      Social Connections          Social Connections and Isolation: 0  Intimate Partner Violence: Not on file  Housing Stability: Not on File (01/16/2018)      Received from [place], [place]      Housing Stability          Housing: 0    OBJECTIVE:  BP 111/72   Pulse 72   Temp 97.4 F (36.3 C) (Temporal)   Ht 6\' 5"  (1.956 m)   Wt 86.1 kg (189 lb 12.8 oz)   SpO2 97%   BMI 22.51 kg/m           Physical Exam  Constitutional:       Appearance: Normal appearance.   HENT:      Head: Normocephalic.   Cardiovascular:      Rate and Rhythm: Normal rate and regular rhythm.   Pulmonary:      Effort: Pulmonary effort is normal. No respiratory distress.      Breath sounds: Normal breath sounds.   Musculoskeletal:      Cervical back: Normal range of motion.   Neurological:      Mental Status: He is alert.   Psychiatric:         Mood and Affect: Mood normal.         Behavior: Behavior normal.         Thought Content: Thought content normal.         Judgment: Judgment normal.           A/P:    (Z00.00) Encounter for medical examination to establish care  (primary encounter diagnosis)    (Z11.3) Screening examination for STI  Plan: HIV ANTIGEN ANTIBODY 5TH GEN, CHLAMYDIA GC         NAAT, TREPONEMA PALLIDUM AB IGG, HEPATITIS B         SURFACE AB QUANT, HEPATITIS B PCR QUAL TO QUANT            (Z00.00) Healthcare maintenance  Plan: HEMOGLOBIN A1C                1. The patient indicates understanding of these issues and agrees with the plan.  2.  The patient is given an After Visit Summary sheet that lists  all of their medications with directions, their allergies, orders placed during this encounter, immunization dates, and follow- up instructions.  3. I reviewed the patient's medical information and medical history   4.  I reconciled the patient's medication list and prepared and supplied needed refills.  5.  I have reviewed the past medical, family, and social history sections including the medications and allergies listed in the above medical record    Nathanial Millman, PA-C, 12/30/2022

## 2022-12-31 LAB — CHLAMYDIA GC NAAT
CHLAMYDIA TRACHOMATIS NAAT: NEGATIVE
NEISSERIA GONORRHOEAE NAAT: NEGATIVE

## 2023-01-01 ENCOUNTER — Encounter (HOSPITAL_BASED_OUTPATIENT_CLINIC_OR_DEPARTMENT_OTHER): Payer: Self-pay | Admitting: Family Medicine

## 2023-01-01 LAB — HIV ANTIGEN ANTIBODY 5TH GEN: HIVAGAB QUALITATIVE: NONREACTIVE

## 2023-01-01 LAB — TREPONEMA PALLIDUM AB IGG: TREPONEMA PALLIDUM AB IGG: NONREACTIVE

## 2023-07-13 ENCOUNTER — Telehealth (HOSPITAL_BASED_OUTPATIENT_CLINIC_OR_DEPARTMENT_OTHER): Payer: Self-pay | Admitting: Family Medicine

## 2023-07-13 NOTE — Telephone Encounter (Signed)
 LVM reshcedule pe with pcp+ team ( rescheduled from [facility] report)

## 2023-09-01 ENCOUNTER — Ambulatory Visit: Payer: BC Managed Care – PPO | Admitting: Family Medicine

## 2023-10-22 ENCOUNTER — Other Ambulatory Visit: Payer: Self-pay

## 2023-10-22 ENCOUNTER — Ambulatory Visit

## 2023-10-22 DIAGNOSIS — Z717 Human immunodeficiency virus [HIV] counseling: Secondary | ICD-10-CM

## 2023-10-22 NOTE — Progress Notes (Signed)
 Reason for testing  Routine testing      Symptoms  Mild discharge      Ever been tested yes   Last encounter 2 1/2 weeks ago  Protection used yes  Partners male  Number last year 6  Sexual activity vaginal  Condom use sometimes   Condom break yes  Tattoos yes  IV Drugs no      --Subjective: History:    No chief complaint on file.      No current outpatient medications on file.     No current facility-administered medications for this visit.       Allergies: Penicillins    Specific reason for testing: Luke Hammond, a 33 y/o male, is at the clinic today looking to get counseling and testing for HIV/STIs. Patient reports that he has been experiencing some mild penile discharge for the last week. Patient expresses concerns of an STI and reports that recently during intercourse the condom broke. Patient was advised that if his symptoms worsen or new symptoms develop to consult his PCP or visit urgent care facility for additional testing. Patient reported no other symptoms at time of appointment.       -Disc:  Transmission (Blood, Semen, Vaginal Fluid and Breastmilk), Risky Behaviors, Non-Risks/Myths,  Seroconversion Period , Result Waiting Time, Possible Results, Feelings Surrounding Possible Results, Plans for Informing of Results, Support Structures and Hep C Risk.     Risk Assessment Completed:  yes  Piercing/Tattoo: yes  Known Partner Risks: no  IV Drug Use: no    Sexual History: heterosexual    Current Partner: female    # of partners (Last Year): 6    Sexual Partners: Female    Sexual Practices: Vaginal    Condom Use: Sometimes    Condom Breakage: yes    Condom Instruction Given: no    Risk Reduction Plans: yes    --Objective: The patient is aware that condom use is the most effective way of protecting against HIV and STIs. Patient was encouraged to get tested at least once a year or before and after every new partner.     HX of STDs: (dx/tx)  Tested Before: Yes:  GC, dates 12/2022 negative  Chlamydia,  dates 12/2022 negative  Syphilis, dates 12/2022 negative  HIV, Dates 12/2022 negative  Other: HCV, dates 05/2019 negative    DV Assessment Completed: yes    Hx of DV/IP abuse: no    Hx of Sexual Assault: no    Sexual Coercion: no    Last Poss. Exposure: 2 1/2 weeks ago     Protection used: yes - condom broke    Seroconversion Period Explained: yes    Testing Options Chosen  Readiness to test:   Still want to test?   Yes    Patient chose to be tested for: HIV, HCV, Chlamydia, GC, and Syphilis    Test sent to[facility]    Type of test: Blood, Urine    --Assessment: HIV, HCV, and STI Education and Risk Reduction      --Plan: Education Counseling and Referrals    Consent for treatment  Informed Consent signed for HIV Testing  STD/HIV risk assessment done  STD/HIV prevention/safer sex discussed  Condoms offered and  Declined  Confidentiality    Referrals:   Support Person Plan: yes  Primary Care: no  Support Services: no  Re-testing Scheduled: no    Follow Up Plan: Patient was screened for HIV, Hep C, Chlamydia, Gonorrhea, and Syphilis today. The patient  was informed they will be getting a call within 2-7 business days with the final test results and was also informed test results would be available on MyChart. The patient had no further questions.

## 2023-10-25 ENCOUNTER — Telehealth (HOSPITAL_BASED_OUTPATIENT_CLINIC_OR_DEPARTMENT_OTHER): Payer: Self-pay | Admitting: Family Medicine

## 2023-10-25 LAB — ICTR HEPATITIS C: ICTR HEPATITIS C ANTIBODY: NONREACTIVE

## 2023-10-25 LAB — ICTR HIV SERUM: ICTR HIV SERUM: NEGATIVE

## 2023-10-25 LAB — ICTR RPR: ICTR RPR: NONREACTIVE

## 2023-10-25 NOTE — Telephone Encounter (Signed)
 LVM reschedule appt oxn[facility] out of office

## 2023-10-26 ENCOUNTER — Telehealth (HOSPITAL_BASED_OUTPATIENT_CLINIC_OR_DEPARTMENT_OTHER): Payer: Self-pay

## 2023-10-26 LAB — ICTR CHLAMYDIA/GONORRHEA
ICTR CHLAMYDIA RNA: POSITIVE — AB
ICTR GONOCOCCUS RNA: NEGATIVE

## 2023-10-26 NOTE — Telephone Encounter (Addendum)
 Patient  confirmed demographics     Informed patient that their tests for HIV, HCV, syphilis, and gonorrhea were negative.    Informed patient that their Urine sample sample tested positive for chlamydia.  Patient was advised that a message would be sent to their provider and that the provider will send the treatment to their pharmacy of choice.  Patient confirmed preferred pharmacy and is listed below.  Patient was asked if they have any aversions to doxycycline or azithromycin. Patient reported aversion to neither.  Patient was advised the standard treatment for chlamydia is 100 mg dose of doxy 2x per day for 7 days.  Patient was advised to remain abstinent during the 7 days of medication as 7 days after to ensure the infection is cleared. Patient was advised to use protection during intercourse.     Patient was advised to notfiy any partners of their test results and to inform them to get tested and treated. Patient was advised to ensure any partners have completed the testing and treatment process before reengaging with them sexually.  Patient was asked if they would like a partner dose sent. Patient reported they would not.     During visit on 10/22/2023, patient described having some mild symptoms. Patient was advised if symptoms did not clear following treatment to consult with PCP or visit an urgent care facility for additional testing/evaluation.      Patient was advised that a member of their PCP's team may reach out to discuss the treatment process with them.     Patient had no further questions        [facility] [phone] [facility] - [place],  - [address]  Phone: [phone] Fax: [phone]

## 2023-10-27 ENCOUNTER — Encounter (HOSPITAL_BASED_OUTPATIENT_CLINIC_OR_DEPARTMENT_OTHER): Payer: Self-pay | Admitting: Physician Assistant

## 2023-10-27 ENCOUNTER — Other Ambulatory Visit (HOSPITAL_BASED_OUTPATIENT_CLINIC_OR_DEPARTMENT_OTHER): Payer: Self-pay | Admitting: Physician Assistant

## 2023-10-27 DIAGNOSIS — A749 Chlamydial infection, unspecified: Secondary | ICD-10-CM | POA: Insufficient documentation

## 2023-10-27 MED ORDER — DOXYCYCLINE HYCLATE 100 MG PO TABS
100.0000 mg | ORAL_TABLET | Freq: Two times a day (BID) | ORAL | 0 refills | Status: AC
Start: 2023-10-27 — End: 2023-11-03

## 2023-11-01 ENCOUNTER — Ambulatory Visit: Payer: BC Managed Care – PPO | Admitting: Family Medicine

## 2023-11-02 ENCOUNTER — Other Ambulatory Visit: Payer: Self-pay

## 2023-11-02 ENCOUNTER — Ambulatory Visit: Attending: Family Medicine | Admitting: Family Medicine

## 2023-11-02 ENCOUNTER — Encounter (HOSPITAL_BASED_OUTPATIENT_CLINIC_OR_DEPARTMENT_OTHER): Payer: Self-pay | Admitting: Family Medicine

## 2023-11-02 DIAGNOSIS — Z13220 Encounter for screening for lipoid disorders: Secondary | ICD-10-CM | POA: Insufficient documentation

## 2023-11-02 DIAGNOSIS — R7303 Prediabetes: Secondary | ICD-10-CM | POA: Insufficient documentation

## 2023-11-02 DIAGNOSIS — M2141 Flat foot [pes planus] (acquired), right foot: Secondary | ICD-10-CM | POA: Insufficient documentation

## 2023-11-02 DIAGNOSIS — M2142 Flat foot [pes planus] (acquired), left foot: Secondary | ICD-10-CM | POA: Insufficient documentation

## 2023-11-02 DIAGNOSIS — A749 Chlamydial infection, unspecified: Secondary | ICD-10-CM | POA: Diagnosis not present

## 2023-11-02 DIAGNOSIS — Z0000 Encounter for general adult medical examination without abnormal findings: Secondary | ICD-10-CM | POA: Insufficient documentation

## 2023-11-02 LAB — LIPID PANEL
Cholesterol: 159 mg/dL (ref 0–239)
HIGH DENSITY LIPOPROTEIN: 54 mg/dL (ref 40–60)
LOW DENSITY LIPOPROTEIN DIRECT: 100 mg/dL (ref 0–189)
TRIGLYCERIDES: 55 mg/dL (ref 0–150)

## 2023-11-02 LAB — HEMOGLOBIN A1C
ESTIMATED AVERAGE GLUCOSE: 117 mg/dL (ref 74–160)
HEMOGLOBIN A1C: 5.7 % — ABNORMAL HIGH (ref 4.0–5.6)

## 2023-11-02 NOTE — Progress Notes (Signed)
 Patient: Luke Hammond  Visit Provider: Everitt Hoa, MD   MRN: [id number]  Date of Service: 11/02/23   Date of Birth: Dec 15, 1990     Interpreter used  Patient is a 33 year old male who presents by himself to establish care and for a complete physical exam.    Current concerns include:      #establish care  Updated PMH, PSH, SH, FH, medications, and allergies as appropriate.   - finishing doxycycline  for Chlamydia by tomorrow. No longer having discharge   - will need TOC around 01/2024    Patient Active Problem List    Routine adult health maintenance         Date Noted: 11/02/2023      Prediabetes         Date Noted: 11/02/2023      Chlamydia infection         Date Noted: 10/27/2023      Flat feet         Date Noted: 06/06/2019      Past Medical History:  06/06/2019: Flat feet  Past Surgical History:  2000: TESTICLE SURGERY; Right      Comment:  as child -- had an undescended testicle. Performed in                [place]   Current Outpatient Medications   Medication Sig    doxycycline  (VIBRA -TABS) 100 MG tablet Take 1 tablet by mouth in the morning and 1 tablet before bedtime. Do all this for 7 days.     No current facility-administered medications for this visit.     Review of Patient's Allergies indicates:   Penicillins             Hives, Itching, Shortness of Breath    Comment:PCN Hives.--  Social History    Tobacco Use      Smoking status: Former        Types: Cigarettes        Passive exposure: Past      Smokeless tobacco: Never      Tobacco comments: Smoked 1 pack of cigarettes per month for ~4 years from ages [age]    Alcohol use: Not Currently    Review of patient's family history indicates:  Problem: Diabetes      Relation: Father          Age of Onset: (Not Specified)  Problem: Thyroid      Relation: Father          Age of Onset: (Not Specified)  Problem: Glaucoma      Relation: Maternal Grandmother          Age of Onset: (Not Specified)  Problem: Hypertension      Relation: Paternal Grandmother           Age of Onset: (Not Specified)  Problem: Cancer - Breast      Relation: Paternal Grandmother          Age of Onset: (Not Specified)  Problem: Diabetes      Relation: Paternal Grandmother          Age of Onset: (Not Specified)  Problem: Hypertension      Relation: Paternal Grandfather          Age of Onset: (Not Specified)  Problem: Cancer - Lung      Relation: Paternal Grandfather          Age of Onset: (Not Specified)          Comment: &  smoker  Problem: Cancer - Lung      Relation: Paternal Uncle          Age of Onset: (Not Specified)          Comment: & smoker    Patient exercises regularly? Works out 6 days per week   [employer]? Yes   Hours of sleep? 8 hours per night   Daily schedule? 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 No, but no vision concerns   Seatbelts? Yes   Helmets? Yes   Sunscreen? Yes   Weapons or guns at home? None   Sexually active? yes    AWQ:   PHQ-2: 0  GAD-2: 1  No substance use. Discussed.     ROS:  Constitutional: No fever or chills  HEENT: No itchy eyes, rhinorrhea, or nasal congestion  Respiratory: No coughing, wheezing, or shortness of breath  Cardiovascular: No palpitations, chest pain, or syncope  Gastrointestinal: No abdominal pain, vomiting, diarrhea or constipation  Neurologic: No muscle weakness, headaches, or vision changes  Musculoskeletal: No joint swelling, joint pain, or muscle pain  Skin: No eczema or rashes  Psychiatric/Behavioral: No concerns about anxiety or depression  Heme: No easy bruising or bleeding    Physical Exam:   11/02/23  1554 11/02/23  1618   BP: 105/64    Site: Right Arm    Position: Sitting    Cuff Size: Regular    Pulse: (!) 45 50   Temp: 97 F (36.1 C)    TempSrc: Temporal    SpO2: 100%    Weight: 83.9 kg (185 lb)    Body mass index is 21.94 kg/m.     General: alert, awake, NAD  HEENT: NCAT. Anicteric. EOMI  Neck: Supple. No lymphadenopathy or thyromegaly appreciated  Heart: RRR. S1 and S2 present. No m/r/g  Resp: CTAB without wheezes or  crackles. Normal WOB on room air   Abdomen: soft, non-tender, non-distended. No HSM.   Extremities: WWP. No peripheral edema  Skin: No rashes visualized. Numerous tattoos   Neuro: Face symmetric, speech clear. Moves all extremities equally. Sensation intact to light touch throughout. Alert and able to engage in interview and exam   Psych: very pleasant & conversant   GU: deferred      Assessment & Plan:  Luke Hammond is a 33 year old who presents for a complete physical exam. The following issues were addressed during today's visit:   Problem List Items Addressed This Visit          Endocrine and Metabolic    Prediabetes    A1c 5.8% in 12/2022. Rechecking A1c today.          Relevant Orders    HEMOGLOBIN A1C       Health Encounters    Routine adult health maintenance - Primary (Chronic)    Education:  Routine anticipatory guidance provided. Other applicable patient education information provided in AVS.     Immunizations:   COVID-19 vaccine: out of season currently    Flu vaccine: out of season currently   HPV vaccine: N/A currently   Pneumonia vaccine: N/A currenlty   Shingles vaccine: N/A currently   Tdap vaccine: UTD from 05/25/2016      Health Maintenance:  Updated medical, surgical, family and social histories  Confirmed medications and refilled as needed  Labs ordered as appropriate  Reviewed recommended cancer screenings and vaccinations  Preventive counseling provided for substance use, sexual health and family planning as  appropriate  Reviewed healthy diet, exercise, dental health, safety and disease prevention    Alcohol use disorder:  reports that he does not currently use alcohol. No evidence of AUD.   AAA Screening (men 65-75, ever smoked): not a candidate for AAA screening.   Colorectal cancer screening: address at age 22. No family history of early colorectal cancer; appears at average risk  Depression & anxiety: negative screenings, see AWQ above   Diabetes screening: prediabetic on 12/30/22, obtaining  another A1c today  HBV screening: immune from 12/30/22 labs    HCV screening: negative 10/22/23  HIV screening: negative 10/22/23 and previously   Healthy diet: reinforced a healthy, well-balanced diet. Minimize soda, junk food, and fast food. Prioritize whole, unprocessed foods, especially fruits/veggies.   High blood pressure: No history of hypertension. Blood pressure normal    Lipid screening: obtaining today    Lung cancer screening (50-80, >20 pack-years, or quit <15 years ago): Not a candidate based on history.  Obesity: Body mass index is 21.94 kg/m.  Osteoporosis: not indicated at this time, no significant risk factors   PSA screening: N/A currently   STI screening: see above   Tobacco use/cessation:  reports that he has quit smoking. His smoking use included cigarettes. He has been exposed to tobacco smoke. He has never used smokeless tobacco.            Infectious Diseases    Chlamydia infection    Diagnosed earlier this month with health counselor. Will complete the 1-week course of doxycycline  tomorrow. Ordered a test-of-cure around 02/02/2024 and reminded on AVS as well.          Relevant Orders    CHLAMYDIA GC NAAT       Musculoskeletal and Injuries    Flat feet    Chronic issue with occasional bilateral foot & ankle discomfort. Referred to previous podiatrist per patient request.          Relevant Orders    REFERRAL TO PODIATRY (INT) (Completed)     Other Visit Diagnoses         Screening, lipid        Relevant Orders    LIPID PANEL                 Darcia Lampi L. Avel Leiter MD  11/02/23   4:19 PM

## 2023-11-02 NOTE — Assessment & Plan Note (Signed)
 Chronic issue with occasional bilateral foot & ankle discomfort. Referred to previous podiatrist per patient request.

## 2023-11-02 NOTE — Assessment & Plan Note (Signed)
 Diagnosed earlier this month with health counselor. Will complete the 1-week course of doxycycline  tomorrow. Ordered a test-of-cure around 02/02/2024 and reminded on AVS as well.

## 2023-11-02 NOTE — Assessment & Plan Note (Signed)
 Education:  Routine anticipatory guidance provided. Other applicable patient education information provided in AVS.     Immunizations:   COVID-19 vaccine: out of season currently    Flu vaccine: out of season currently   HPV vaccine: N/A currently   Pneumonia vaccine: N/A currenlty   Shingles vaccine: N/A currently   Tdap vaccine: UTD from 05/25/2016      Health Maintenance:  Updated medical, surgical, family and social histories  Confirmed medications and refilled as needed  Labs ordered as appropriate  Reviewed recommended cancer screenings and vaccinations  Preventive counseling provided for substance use, sexual health and family planning as appropriate  Reviewed healthy diet, exercise, dental health, safety and disease prevention    Alcohol use disorder:  reports that he does not currently use alcohol. No evidence of AUD.   AAA Screening (men 65-75, ever smoked): not a candidate for AAA screening.   Colorectal cancer screening: address at age 35. No family history of early colorectal cancer; appears at average risk  Depression & anxiety: negative screenings, see AWQ above   Diabetes screening: prediabetic on 12/30/22, obtaining another A1c today  HBV screening: immune from 12/30/22 labs    HCV screening: negative 10/22/23  HIV screening: negative 10/22/23 and previously   Healthy diet: reinforced a healthy, well-balanced diet. Minimize soda, junk food, and fast food. Prioritize whole, unprocessed foods, especially fruits/veggies.   High blood pressure: No history of hypertension. Blood pressure normal    Lipid screening: obtaining today    Lung cancer screening (50-80, >20 pack-years, or quit <15 years ago): Not a candidate based on history.  Obesity: Body mass index is 21.94 kg/m.  Osteoporosis: not indicated at this time, no significant risk factors   PSA screening: N/A currently   STI screening: see above   Tobacco use/cessation:  reports that he has quit smoking. His smoking use included cigarettes. He has been  exposed to tobacco smoke. He has never used smokeless tobacco.

## 2023-11-02 NOTE — Assessment & Plan Note (Signed)
 A1c 5.8% in 12/2022. Rechecking A1c today.

## 2023-11-02 NOTE — Patient Instructions (Signed)
 Today, we completed your annual physical exam.   We reviewed your need for cancer screening, checked blood pressure, assessed need for cholesterol testing, reviewed your medications, and discussed sexual health.      I encourage you to consider your personal goals and reflect on how your overall health and medical needs fit into these goals. We discussed a variety of recommendations, and it is very important that the medical decisions I recommend meet your personal needs and values.      For general recommendations, I encourage the following:  Prioritize the people, experiences, and goals you have. Limit things that are not a priority.  Eat food that is fresh. Prioritize fruits and vegetables, healthy oils, and unprocessed food. Minimize salt. Try to avoid fast food. Share and cook food with people you care about.  Move your body. Find activities that make you happy, keep you active, keep your heart pumping. For some, this is a daily walk with a friend. For others it is intense exercise.   Sleep well. Find time to have sustaining, refreshing sleep. If you do not have good sleep habits, please let me know.  Sexual health. You should be able to have consensual, pleasurable sexual experiences with the partner(s) of your choice. Please protect yourself from infection with external condoms if you do not have a long-term partner and get regular checks for sexually transmitted infections (STIs), especially if you have symptoms.  Meaningful activities. Find work, hobbies, and volunteering opportunities that are rewarding and stimulating. Feeling fulfilled and accomplished is very important.  Community. Find a group of people with whom you can connect and share experiences. They are an excellent resource and support.   Driving. Always wear a seatbelt. Avoid drinking and driving. Avoid driving while high.   Alcohol. Moderation is best. Try to limit to 1-2 drinks a day at most. Avoid binging on alcohol (>4-5 drinks at one  time).   Tobacco. Continue to avoid tobacco. This will help prevent cancer, heart disease, stroke, and lots of other health problems.   Safety. Intimate partner violence and community safety are very common and often not discussed. Please let me know if this is affecting you. I am here to support you.  Housing. A safe home is important for your health.  Dental. Brush your teeth twice a day. Try to floss every day. Go to the dentist 2 times per year. Your oral health is important.   Cancer screening. Follow recommendations for cancer screening.

## 2023-11-03 ENCOUNTER — Ambulatory Visit (HOSPITAL_BASED_OUTPATIENT_CLINIC_OR_DEPARTMENT_OTHER): Payer: Self-pay | Admitting: Family Medicine

## 2023-11-15 ENCOUNTER — Ambulatory Visit (HOSPITAL_BASED_OUTPATIENT_CLINIC_OR_DEPARTMENT_OTHER): Admitting: Family Medicine

## 2023-11-16 ENCOUNTER — Telehealth (HOSPITAL_BASED_OUTPATIENT_CLINIC_OR_DEPARTMENT_OTHER): Payer: Self-pay

## 2023-11-16 ENCOUNTER — Ambulatory Visit: Admitting: Podiatrist

## 2023-11-16 NOTE — Telephone Encounter (Signed)
 11/16/2023--pt missed todays appt with Dr Lucrecia for 08:45am. Called and lvm for callback to reschedule. gave my name and phone# as contact. if no callback by EOD will mail out no show letter--ch

## 2023-12-07 ENCOUNTER — Encounter (HOSPITAL_BASED_OUTPATIENT_CLINIC_OR_DEPARTMENT_OTHER): Payer: Self-pay

## 2024-03-09 ENCOUNTER — Other Ambulatory Visit: Payer: Self-pay

## 2024-03-09 ENCOUNTER — Ambulatory Visit

## 2024-03-09 DIAGNOSIS — Z717 Human immunodeficiency virus [HIV] counseling: Secondary | ICD-10-CM

## 2024-03-09 NOTE — Progress Notes (Signed)
--  Subjective: History:    No chief complaint on file.      No current outpatient medications on file.     No current facility-administered medications for this visit.       Allergies: Penicillins    Specific reason for testing: Luke Hammond, a 33 y/o male, is at the clinic today looking to get counseling and testing for HIV/STIs. Pt states they do not have any concerns related to STIs and today's visit is just routine.  The patient reports having no symptoms at time of appointment.      -Disc:  Transmission (Blood, Semen, Vaginal Fluid and Breastmilk), Risky Behaviors, Non-Risks/Myths,  Seroconversion Period , Result Waiting Time, Possible Results, Feelings Surrounding Possible Results, Plans for Informing of Results, Support Structures and Hep C Risk.     Risk Assessment Completed:  yes  Piercing/Tattoo: yes  Known Partner Risks: no  IV Drug Use: no     Sexual History: heterosexual     Current Partner: female     # of partners (Last Year): 1 new partner since last testing     Sexual Partners: Female     Sexual Practices: Vaginal     Condom Use: Sometimes     Condom Breakage: yes     Condom Instruction Given: no     Risk Reduction Plans: yes     --Objective: The patient is aware that condom use is the most effective way of protecting against HIV and STIs. Patient was encouraged to get tested at least once a year or before and after every new partner.     HX of STDs: (dx/tx)  Tested Before: Yes:  GC, dates 09/2023 negative  Chlamydia, dates 09/2023 positive  Syphilis, dates 09/2023 negative  HIV, Dates 09/2023 negative  Other: HCV, dates 09/2023 negative    DV Assessment Completed: yes    Hx of DV/IP abuse: no    Hx of Sexual Assault: no    Sexual Coercion: no    Last Poss. Exposure: July '25 (~3 months)    Protection used: yes    Seroconversion Period Explained: yes    Testing Options Chosen  Readiness to test:   Still want to test?   Yes    Patient chose to be tested for: HIV, HCV, Chlamydia, GC, and  Syphilis    [facility]    Type of test: Blood, Urine    --Assessment: HIV, HCV, and STI Education and Risk Reduction      --Plan: Education Counseling and Referrals    Consent for treatment  Informed Consent signed for HIV Testing  STD/HIV risk assessment done  STD/HIV prevention/safer sex discussed  Condoms offered and  Declined  Confidentiality    Referrals:   Support Person Plan: yes  Primary Care: no  Support Services: no  Re-testing Scheduled: no    Follow Up Plan: Patient was screened for HIV, Hep C, Chlamydia, Gonorrhea, and Syphilis today. The patient was informed test results would be available on MyChart within 2-7 business days and will receive a call if needed. The patient had no further questions.

## 2024-03-13 LAB — ICTR RPR: ICTR RPR: NONREACTIVE

## 2024-03-13 LAB — ICTR HEPATITIS C: ICTR HEPATITIS C ANTIBODY: NONREACTIVE

## 2024-03-13 LAB — ICTR HIV SERUM: ICTR HIV SERUM: NEGATIVE

## 2024-03-15 ENCOUNTER — Encounter (HOSPITAL_BASED_OUTPATIENT_CLINIC_OR_DEPARTMENT_OTHER): Payer: Self-pay

## 2024-03-15 LAB — ICTR CHLAMYDIA/GONORRHEA
